# Patient Record
Sex: Male | Born: 1970 | Race: White | Hispanic: No | Smoking: Former smoker
Health system: Southern US, Community
[De-identification: ages and names within clinical notes are randomized; demographics above are authoritative.]

## PROBLEM LIST (undated history)

## (undated) ENCOUNTER — Emergency Department (HOSPITAL_COMMUNITY): Discharge status: Absent without leave

## (undated) DIAGNOSIS — K219 Gastro-esophageal reflux disease without esophagitis: Secondary | ICD-10-CM

## (undated) DIAGNOSIS — R1310 Dysphagia, unspecified: Secondary | ICD-10-CM

## (undated) HISTORY — PX: TONSILLECTOMY: SUR1361

## (undated) HISTORY — PX: ADENOIDECTOMY: SUR15

## (undated) HISTORY — PX: HERNIA REPAIR: SHX51

## (undated) HISTORY — PX: WRIST SURGERY: SHX841

## (undated) HISTORY — DX: Dysphagia, unspecified: R13.10

## (undated) HISTORY — PX: FACIAL FRACTURE SURGERY: SHX1570

---

## 2010-10-26 ENCOUNTER — Ambulatory Visit: Payer: Worker's Compensation | Admitting: Rehabilitative and Restorative Service Providers"

## 2010-10-27 ENCOUNTER — Ambulatory Visit: Payer: Worker's Compensation | Attending: Occupational Medicine

## 2010-10-27 DIAGNOSIS — M545 Low back pain, unspecified: Secondary | ICD-10-CM | POA: Insufficient documentation

## 2010-10-27 DIAGNOSIS — R5381 Other malaise: Secondary | ICD-10-CM | POA: Insufficient documentation

## 2010-10-27 DIAGNOSIS — IMO0001 Reserved for inherently not codable concepts without codable children: Secondary | ICD-10-CM | POA: Insufficient documentation

## 2010-10-27 DIAGNOSIS — M2560 Stiffness of unspecified joint, not elsewhere classified: Secondary | ICD-10-CM | POA: Insufficient documentation

## 2011-04-02 ENCOUNTER — Emergency Department (HOSPITAL_COMMUNITY)
Admission: EM | Admit: 2011-04-02 | Discharge: 2011-04-03 | Disposition: A | Discharge status: Home / Self Care | Attending: Emergency Medicine | Admitting: Emergency Medicine

## 2011-04-02 DIAGNOSIS — J45909 Unspecified asthma, uncomplicated: Secondary | ICD-10-CM | POA: Insufficient documentation

## 2011-04-02 DIAGNOSIS — R0602 Shortness of breath: Secondary | ICD-10-CM | POA: Insufficient documentation

## 2011-04-02 DIAGNOSIS — R0789 Other chest pain: Secondary | ICD-10-CM | POA: Insufficient documentation

## 2011-04-08 ENCOUNTER — Emergency Department (HOSPITAL_COMMUNITY)
Admission: EM | Admit: 2011-04-08 | Discharge: 2011-04-08 | Disposition: A | Discharge status: Home / Self Care | Attending: Emergency Medicine | Admitting: Emergency Medicine

## 2011-04-08 ENCOUNTER — Emergency Department (HOSPITAL_COMMUNITY)

## 2011-04-08 ENCOUNTER — Encounter (HOSPITAL_COMMUNITY): Payer: Self-pay | Admitting: Radiology

## 2011-04-08 DIAGNOSIS — J45901 Unspecified asthma with (acute) exacerbation: Secondary | ICD-10-CM | POA: Insufficient documentation

## 2011-04-08 DIAGNOSIS — R0609 Other forms of dyspnea: Secondary | ICD-10-CM | POA: Insufficient documentation

## 2011-04-08 DIAGNOSIS — R0989 Other specified symptoms and signs involving the circulatory and respiratory systems: Secondary | ICD-10-CM | POA: Insufficient documentation

## 2011-04-08 LAB — CBC
Hemoglobin: 16.2 g/dL (ref 13.0–17.0)
Platelets: 289 10*3/uL (ref 150–400)
RBC: 5.31 MIL/uL (ref 4.22–5.81)
WBC: 10.2 10*3/uL (ref 4.0–10.5)

## 2011-04-08 LAB — BASIC METABOLIC PANEL
CO2: 28 mEq/L (ref 19–32)
Glucose, Bld: 121 mg/dL — ABNORMAL HIGH (ref 70–99)
Potassium: 3.9 mEq/L (ref 3.5–5.1)
Sodium: 139 mEq/L (ref 135–145)

## 2011-04-08 LAB — TROPONIN I: Troponin I: <0.3 ng/mL (ref <0.30)

## 2011-04-08 LAB — D-DIMER, QUANTITATIVE: D-Dimer, Quant: 0.25 ug/mL-FEU (ref 0.00–0.48)

## 2011-04-08 MED ORDER — IOHEXOL 300 MG/ML  SOLN
100.0000 mL | Freq: Once | INTRAMUSCULAR | Status: AC | PRN
  Administered 2011-04-08: 100 mL via INTRAVENOUS

## 2011-10-29 ENCOUNTER — Ambulatory Visit: Payer: Self-pay

## 2011-10-29 ENCOUNTER — Other Ambulatory Visit: Payer: Self-pay | Admitting: Occupational Medicine

## 2011-10-29 DIAGNOSIS — Z0000 Encounter for general adult medical examination without abnormal findings: Secondary | ICD-10-CM

## 2012-06-28 DIAGNOSIS — M549 Dorsalgia, unspecified: Secondary | ICD-10-CM | POA: Insufficient documentation

## 2012-07-19 ENCOUNTER — Encounter (HOSPITAL_COMMUNITY): Payer: Self-pay | Admitting: Emergency Medicine

## 2012-07-19 ENCOUNTER — Observation Stay (HOSPITAL_COMMUNITY)
Admission: EM | Admit: 2012-07-19 | Discharge: 2012-07-20 | Disposition: A | Discharge status: Home / Self Care | Attending: Internal Medicine | Admitting: Internal Medicine

## 2012-07-19 DIAGNOSIS — J45901 Unspecified asthma with (acute) exacerbation: Principal | ICD-10-CM

## 2012-07-19 DIAGNOSIS — Z23 Encounter for immunization: Secondary | ICD-10-CM | POA: Insufficient documentation

## 2012-07-19 DIAGNOSIS — E876 Hypokalemia: Secondary | ICD-10-CM | POA: Diagnosis present

## 2012-07-19 DIAGNOSIS — J45909 Unspecified asthma, uncomplicated: Secondary | ICD-10-CM | POA: Diagnosis present

## 2012-07-19 DIAGNOSIS — J029 Acute pharyngitis, unspecified: Secondary | ICD-10-CM | POA: Diagnosis present

## 2012-07-19 NOTE — ED Provider Notes (Signed)
History     CSN: [id number]  Arrival date & time 07/19/12  2347   First MD Initiated Contact with Patient 07/19/12 2348      Chief Complaint  Patient presents with  . Asthma    (Consider location/radiation/quality/duration/timing/severity/associated sxs/prior treatment) Patient is a 41 y.o. male presenting with asthma and wheezing. The history is provided by the patient. No language interpreter was used.  Asthma This is a recurrent problem. The current episode started 1 to 2 hours ago. The problem occurs constantly. The problem has not changed since onset.Pertinent negatives include no chest pain, no abdominal pain, no headaches and no shortness of breath. Associated symptoms comments: Sore throat. Nothing aggravates the symptoms. Nothing relieves the symptoms. He has tried nothing for the symptoms. The treatment provided no relief.  Wheezing  The current episode started today. The onset was sudden. The problem occurs continuously. The problem has been unchanged. The problem is moderate. The symptoms are relieved by beta-agonist inhalers. Nothing aggravates the symptoms. Associated symptoms include sore throat and wheezing. Pertinent negatives include no chest pain, no fever, no rhinorrhea, no stridor and no shortness of breath. His past medical history is significant for asthma. He has been behaving normally. Recently, medical care has been given by EMS. Services received include medications given.    Past Medical History  Diagnosis Date  . Asthma     No past surgical history on file.  No family history on file.  History  Substance Use Topics  . Smoking status: Not on file  . Smokeless tobacco: Not on file  . Alcohol Use: Not on file      Review of Systems  Constitutional: Negative for fever.  HENT: Positive for sore throat. Negative for rhinorrhea.   Respiratory: Positive for wheezing. Negative for shortness of breath and stridor.   Cardiovascular: Negative for chest  pain.  Gastrointestinal: Negative for abdominal pain.  Neurological: Negative for headaches.  All other systems reviewed and are negative.    Allergies  Review of patient's allergies indicates no known allergies.  Home Medications  No current outpatient prescriptions on file.  There were no vitals taken for this visit.  Physical Exam  Constitutional: He is oriented to person, place, and time. He appears well-developed and well-nourished. No distress.  HENT:  Head: Normocephalic and atraumatic.  Mouth/Throat: Oropharynx is clear and moist.  Eyes: Conjunctivae normal are normal. Pupils are equal, round, and reactive to light.  Neck: Normal range of motion. Neck supple.  Cardiovascular: Normal rate, regular rhythm and intact distal pulses.   Pulmonary/Chest: Effort normal. No stridor. He has wheezes.  Abdominal: Soft. Bowel sounds are normal. There is no tenderness. There is no rebound and no guarding.  Musculoskeletal: Normal range of motion.  Lymphadenopathy:    He has no cervical adenopathy.  Neurological: He is alert and oriented to person, place, and time.  Skin: Skin is warm and dry.  Psychiatric: He has a normal mood and affect.    ED Course  Procedures (including critical care time)  Labs Reviewed - No data to display No results found.   No diagnosis found.    MDM   Date: 07/20/2012  Rate: 118  Rhythm: sinus tachycardia  QRS Axis: normal  Intervals: normal  ST/T Wave abnormalities: nonspecific ST changes  Conduction Disutrbances:none  Narrative Interpretation:   Old EKG Reviewed: none available     MDM Interpretation: labs, ECG and x-ray Total time providing critical care: 30-74 minutes. This excludes time spent performing  separately reportable procedures and services. Consults: admitting MD  bolus and treatment of hypokalemia  CRITICAL CARE Performed by: Jasmine Awe   Total critical care time: 30 minutes  Critical care time was  exclusive of separately billable procedures and treating other patients.  Critical care was necessary to treat or prevent imminent or life-threatening deterioration.  Critical care was time spent personally by me on the following activities: development of treatment plan with patient and/or surrogate as well as nursing, discussions with consultants, evaluation of patient's response to treatment, examination of patient, obtaining history from patient or surrogate, ordering and performing treatments and interventions, ordering and review of laboratory studies, ordering and review of radiographic studies, pulse oximetry and re-evaluation of patient's condition.    Jasmine Awe, MD 07/20/12 [phone]

## 2012-07-19 NOTE — ED Notes (Signed)
Pt arrived by ems. Pt woken from sleep with asthma attack. ems administered albuterol neb 5mg . Followed by 125 solu-medrol. Also second neb 5mg  albuterol of atrovent. Lungs sounds improved however pt states he doesn't feel much better. Pt. Stats 99% with tx. Vs wnl; sinus tach. bp 142/82.

## 2012-07-20 ENCOUNTER — Other Ambulatory Visit: Payer: Self-pay

## 2012-07-20 ENCOUNTER — Encounter (HOSPITAL_COMMUNITY): Payer: Self-pay | Admitting: General Practice

## 2012-07-20 ENCOUNTER — Emergency Department (HOSPITAL_COMMUNITY)

## 2012-07-20 DIAGNOSIS — J45909 Unspecified asthma, uncomplicated: Secondary | ICD-10-CM | POA: Diagnosis present

## 2012-07-20 DIAGNOSIS — J029 Acute pharyngitis, unspecified: Secondary | ICD-10-CM | POA: Diagnosis present

## 2012-07-20 DIAGNOSIS — E876 Hypokalemia: Secondary | ICD-10-CM | POA: Diagnosis present

## 2012-07-20 DIAGNOSIS — J45901 Unspecified asthma with (acute) exacerbation: Secondary | ICD-10-CM

## 2012-07-20 LAB — CBC
HCT: 40.5 % (ref 39.0–52.0)
Hemoglobin: 14.4 g/dL (ref 13.0–17.0)
MCHC: 35.6 g/dL (ref 30.0–36.0)
MCV: 84 fL (ref 78.0–100.0)
WBC: 8.1 10*3/uL (ref 4.0–10.5)

## 2012-07-20 LAB — CBC WITH DIFFERENTIAL/PLATELET
Basophils Absolute: 0 10*3/uL (ref 0.0–0.1)
Basophils Relative: 0 % (ref 0–1)
HCT: 41.6 % (ref 39.0–52.0)
Hemoglobin: 14.7 g/dL (ref 13.0–17.0)
Lymphocytes Relative: 37 % (ref 12–46)
MCHC: 35.3 g/dL (ref 30.0–36.0)
Monocytes Absolute: 0.7 10*3/uL (ref 0.1–1.0)
Monocytes Relative: 7 % (ref 3–12)
Neutro Abs: 4.9 10*3/uL (ref 1.7–7.7)
Neutrophils Relative %: 54 % (ref 43–77)
RDW: 12.3 % (ref 11.5–15.5)
WBC: 9.1 10*3/uL (ref 4.0–10.5)

## 2012-07-20 LAB — POCT I-STAT, CHEM 8
Chloride: 107 mEq/L (ref 96–112)
Glucose, Bld: 110 mg/dL — ABNORMAL HIGH (ref 70–99)
HCT: 41 % (ref 39.0–52.0)
Hemoglobin: 13.9 g/dL (ref 13.0–17.0)
Potassium: 2.8 mEq/L — ABNORMAL LOW (ref 3.5–5.1)
Sodium: 143 mEq/L (ref 135–145)

## 2012-07-20 LAB — POCT I-STAT TROPONIN I: Troponin i, poc: 0 ng/mL (ref 0.00–0.08)

## 2012-07-20 LAB — BASIC METABOLIC PANEL
BUN: 14 mg/dL (ref 6–23)
CO2: 25 mEq/L (ref 19–32)
Chloride: 107 mEq/L (ref 96–112)
GFR calc Af Amer: >90 mL/min (ref >90)
Potassium: 4.3 mEq/L (ref 3.5–5.1)

## 2012-07-20 MED ORDER — PNEUMOCOCCAL VAC POLYVALENT 25 MCG/0.5ML IJ INJ
0.5000 mL | INJECTION | INTRAMUSCULAR | Status: AC
  Administered 2012-07-20: 0.5 mL via INTRAMUSCULAR
  Filled 2012-07-20: qty 0.5

## 2012-07-20 MED ORDER — ACETAMINOPHEN 650 MG RE SUPP: 650.0000 mg | Freq: Four times a day (QID) | RECTAL | Status: DC | PRN

## 2012-07-20 MED ORDER — INFLUENZA VIRUS VACC SPLIT PF IM SUSP: 0.5000 mL | INTRAMUSCULAR | Status: DC

## 2012-07-20 MED ORDER — LEVOFLOXACIN 500 MG PO TABS: 500.0000 mg | ORAL_TABLET | Freq: Every day | ORAL | Status: DC

## 2012-07-20 MED ORDER — ONDANSETRON HCL 4 MG PO TABS: 4.0000 mg | ORAL_TABLET | Freq: Four times a day (QID) | ORAL | Status: DC | PRN

## 2012-07-20 MED ORDER — SODIUM CHLORIDE 0.9 % IJ SOLN
3.0000 mL | Freq: Two times a day (BID) | INTRAMUSCULAR | Status: DC
  Administered 2012-07-20: 3 mL via INTRAVENOUS

## 2012-07-20 MED ORDER — DEXTROSE 5 % IV SOLN
1.0000 g | Freq: Once | INTRAVENOUS | Status: AC
  Administered 2012-07-20: 1 g via INTRAVENOUS
  Filled 2012-07-20: qty 10

## 2012-07-20 MED ORDER — PREDNISONE 20 MG PO TABS: 40.0000 mg | ORAL_TABLET | Freq: Every day | ORAL | Status: DC

## 2012-07-20 MED ORDER — LORATADINE-PSEUDOEPHEDRINE ER 5-120 MG PO TB12: 1.0000 | ORAL_TABLET | Freq: Two times a day (BID) | ORAL | Status: DC

## 2012-07-20 MED ORDER — POTASSIUM CHLORIDE 10 MEQ/100ML IV SOLN
10.0000 meq | INTRAVENOUS | Status: AC
  Administered 2012-07-20 (×4): 10 meq via INTRAVENOUS
  Filled 2012-07-20 (×5): qty 100

## 2012-07-20 MED ORDER — DEXTROSE 5 % IV SOLN
500.0000 mg | Freq: Once | INTRAVENOUS | Status: AC
  Administered 2012-07-20: 500 mg via INTRAVENOUS
  Filled 2012-07-20: qty 500

## 2012-07-20 MED ORDER — ALBUTEROL SULFATE (5 MG/ML) 0.5% IN NEBU
5.0000 mg | INHALATION_SOLUTION | Freq: Once | RESPIRATORY_TRACT | Status: AC
  Administered 2012-07-20: 5 mg via RESPIRATORY_TRACT
  Filled 2012-07-20: qty 1

## 2012-07-20 MED ORDER — ACETAMINOPHEN 325 MG PO TABS: 650.0000 mg | ORAL_TABLET | Freq: Four times a day (QID) | ORAL | Status: DC | PRN

## 2012-07-20 MED ORDER — LEVOFLOXACIN IN D5W 500 MG/100ML IV SOLN
500.0000 mg | INTRAVENOUS | Status: DC
  Administered 2012-07-20: 500 mg via INTRAVENOUS
  Filled 2012-07-20 (×2): qty 100

## 2012-07-20 MED ORDER — PREDNISONE 20 MG PO TABS
40.0000 mg | ORAL_TABLET | Freq: Every day | ORAL | Status: DC
  Administered 2012-07-20: 40 mg via ORAL
  Filled 2012-07-20 (×2): qty 2

## 2012-07-20 MED ORDER — POTASSIUM CHLORIDE IN NACL 40-0.9 MEQ/L-% IV SOLN
INTRAVENOUS | Status: DC
  Administered 2012-07-20: 06:00:00 via INTRAVENOUS
  Filled 2012-07-20 (×4): qty 1000

## 2012-07-20 MED ORDER — ONDANSETRON HCL 4 MG/2ML IJ SOLN: 4.0000 mg | Freq: Four times a day (QID) | INTRAMUSCULAR | Status: DC | PRN

## 2012-07-20 MED ORDER — INFLUENZA VIRUS VACC SPLIT PF IM SUSP
0.5000 mL | INTRAMUSCULAR | Status: AC
  Administered 2012-07-20: 0.5 mL via INTRAMUSCULAR
  Filled 2012-07-20: qty 0.5

## 2012-07-20 MED ORDER — ALBUTEROL SULFATE (5 MG/ML) 0.5% IN NEBU
2.5000 mg | INHALATION_SOLUTION | Freq: Four times a day (QID) | RESPIRATORY_TRACT | Status: DC
  Administered 2012-07-20 (×2): 2.5 mg via RESPIRATORY_TRACT
  Filled 2012-07-20 (×2): qty 0.5

## 2012-07-20 MED ORDER — ALBUTEROL SULFATE HFA 108 (90 BASE) MCG/ACT IN AERS: 2.0000 | INHALATION_SPRAY | Freq: Four times a day (QID) | RESPIRATORY_TRACT | Status: DC | PRN

## 2012-07-20 MED ORDER — ALBUTEROL SULFATE (5 MG/ML) 0.5% IN NEBU: 2.5000 mg | INHALATION_SOLUTION | RESPIRATORY_TRACT | Status: DC | PRN

## 2012-07-20 NOTE — Discharge Summary (Signed)
Physician Discharge Summary  Dustin Macias BJY:[id number] DOB: 10/14/1970 DOA: 07/19/2012  PCP: Dustin Lightning, MD  Admit date: 07/19/2012 Discharge date: 07/20/2012  Time spent: 60 minutes  Recommendations for Outpatient Follow-up:  1. Followup with PCP one week post discharge. Or followup basic metabolic profile need to be obtained to followup on patient's electrolytes. Patient also need followup on his asthma exacerbation.  Discharge Diagnoses:  Principal Problem:  *Asthma exacerbation Active Problems:  Sorethroat  Hypokalemia   Discharge Condition:  Stable and improved  Diet recommendation: Regular  Filed Weights   07/20/12 0313  Weight: 114.7 kg (252 lb 13.9 oz)    History of present illness:  Dustin Macias is an 41 y.o. male with hx of asthma on PRN ventolin inhaler, otherwise healthy on no meds, presents to the ER with sorethroat and wheezing for the past 2 days. He has no fever, chest pain, n/v, abd pain or myalgia. He has a sorethroat and wasn't able to speak. He has no distant travel or ill contact. He was seen last time for asthma exarcebation last June, in the ER, but was not admitted at that time. In the ER, he was given nebs, which improved his breathing markedly and he was able to talk. Work up in the ER included an EKG with ST and no acute ST-T changes, a CXR with ?Left lower lobe atelectasis vs infiltrate. He has no fever or cough, and has no leukocytosis. He was however found to have a K of 2.8. Hospitalist was asked to admit him for "PNA and hypokalemia"   Hospital Course:  #1 acute asthma exacerbation Patient was admitted with acute asthma exacerbation. Chest x-ray was worrisome for possible pneumonia however patient was afebrile had a normal white count would and did not have a productive cough. Patient was placed on oral prednisone he was placed on nebulizer treatments as well as IV Levaquin. Patient improved clinically to be discharged home on a steroid  taper, albuterol, 5 days of oral Levaquin and Claritin-D. Patient is to followup with PCP one week post discharge.  #2 hypokalemia On admission patient was noted to be hypokalemic which was likely secondary to albuterol use. Patient's potassium was repleted and his hypokalemia had resolved by day of discharge. On admission patient's potassium was 2.8 by day of discharge his potassium was 4.3. Patient will followup with PCP one week post discharge and a basic metabolic profile need to be obtained to followup on his electrolytes.  Procedures:  X-ray of the neck 07/20/2012  Chest x-ray 07/20/2012  Consultations:  None  Discharge Exam: Filed Vitals:   07/20/12 0500 07/20/12 0908 07/20/12 1356 07/20/12 1422  BP: 128/77  135/73   Pulse: 105  92   Temp: 98.7 F (37.1 C)  97.7 F (36.5 C)   TempSrc: Oral  Oral   Resp: 20  20   Height:      Weight:      SpO2: 96% 95% 95% 95%    General: NAD Cardiovascular: RRR Respiratory: CTAB  Discharge Instructions  Discharge Orders    Future Orders Please Complete By Expires   Diet general      Increase activity slowly      Discharge instructions      Comments:   Follow up with Dustin Lightning, MD in 1 week.       Medication List     As of 07/20/2012  3:11 PM    STOP taking these medications  albuterol (2.5 MG/3ML) 0.083% nebulizer solution   Commonly known as: PROVENTIL      ipratropium 0.02 % nebulizer solution   Commonly known as: ATROVENT      methylPREDNISolone sodium succinate 125 mg/2 mL injection   Commonly known as: SOLU-MEDROL      TAKE these medications         albuterol 108 (90 BASE) MCG/ACT inhaler   Commonly known as: PROVENTIL HFA;VENTOLIN HFA   Inhale 2 puffs into the lungs every 6 (six) hours as needed for wheezing or shortness of breath. Use every 6 hours x 4 days then use as needed.      diclofenac 75 MG EC tablet   Commonly known as: VOLTAREN   Take 75 mg by mouth 2 (two) times daily.       levofloxacin 500 MG tablet   Commonly known as: LEVAQUIN   Take 1 tablet (500 mg total) by mouth daily. Take for 5 days then stop.      loratadine-pseudoephedrine 5-120 MG per tablet   Commonly known as: CLARITIN-D 12-hour   Take 1 tablet by mouth 2 (two) times daily. Take for 5 days then stop      NYQUIL PO   Take 30 mLs by mouth every 6 (six) hours as needed. For cold symptoms      predniSONE 20 MG tablet   Commonly known as: DELTASONE   Take 2 tablets (40 mg total) by mouth daily before breakfast. Take 2 tablets (40mg ) daily x 3 days, then 1 tablet (20mg ) daily x 3 days then stop      tiZANidine 4 MG tablet   Commonly known as: ZANAFLEX   Take 4 mg by mouth every 6 (six) hours as needed. For muscle spasms      traMADol 50 MG tablet   Commonly known as: ULTRAM   Take 50 mg by mouth every 6 (six) hours as needed. For pain           Follow-up Information    Follow up with Dustin Lightning, MD. Schedule an appointment as soon as possible for a visit in 1 week.   Contact information:   [address] [facility] [place] [address] [phone]           The results of significant diagnostics from this hospitalization (including imaging, microbiology, ancillary and laboratory) are listed below for reference.    Significant Diagnostic Studies: Dg Neck Soft Tissue  07/20/2012  *RADIOLOGY REPORT*  Clinical Data: Shortness of breath, hoarseness, throat pain.  NECK SOFT TISSUES - 1+ VIEW  Comparison:  None.  Findings:  There is no evidence of retropharyngeal soft tissue swelling or epiglottic enlargement.  The cervical airway is unremarkable and no radio-opaque foreign body identified.  IMPRESSION: Negative.   Original Report Authenticated By: Charlett Nose, M.D.    Dg Chest 2 View  07/20/2012  *RADIOLOGY REPORT*  Clinical Data: Asthma exacerbation  CHEST - 2 VIEW  Comparison: 10/29/2011  Findings: The heart size and mediastinal contours are  within normal limits. Atelectasis or infiltrate is noted within the left lower lobe.  There are mild, age indeterminate compression deformities within the mid thoracic spine.  The visualized skeletal structures are unremarkable.  IMPRESSION: 1.  Left base opacity which may represent atelectasis or infiltrate. 2.  Mild age indeterminant thoracic compression deformities.   Original Report Authenticated By: Signa Kell, M.D.     Microbiology: Recent Results (from the past 240 hour(s))  RAPID STREP  SCREEN     Status: Normal   Collection Time   07/20/12 12:06 AM      Component Value Range Status Comment   Streptococcus, Group A Screen (Direct) NEGATIVE  NEGATIVE Final      Labs: Basic Metabolic Panel:  Lab 07/20/12 1610 07/20/12 0137 07/20/12 0019  NA 141 -- 143  K 4.3 -- 2.8*  CL 107 -- 107  CO2 25 -- --  GLUCOSE 182* -- 110*  BUN 14 -- 22  CREATININE 0.75 -- 1.10  CALCIUM 9.1 -- --  MG -- 2.0 --  PHOS -- -- --   Liver Function Tests: No results found for this basename: AST:5,ALT:5,ALKPHOS:5,BILITOT:5,PROT:5,ALBUMIN:5 in the last 168 hours No results found for this basename: LIPASE:5,AMYLASE:5 in the last 168 hours No results found for this basename: AMMONIA:5 in the last 168 hours CBC:  Lab 07/20/12 0812 07/20/12 0019 07/20/12  WBC 8.1 -- 9.1  NEUTROABS -- -- 4.9  HGB 14.4 13.9 14.7  HCT 40.5 41.0 41.6  MCV 84.0 -- 84.0  PLT 227 -- 212   Cardiac Enzymes: No results found for this basename: CKTOTAL:5,CKMB:5,CKMBINDEX:5,TROPONINI:5 in the last 168 hours BNP: BNP (last 3 results) No results found for this basename: PROBNP:3 in the last 8760 hours CBG: No results found for this basename: GLUCAP:5 in the last 168 hours     Signed:  Zaeda Mcferran  [facility] 07/20/2012, 3:11 PM

## 2012-07-20 NOTE — Plan of Care (Signed)
Problem: Phase I Progression Outcomes Goal: Initial discharge plan identified Outcome: Completed/Met Date Met:  07/20/12 To return home with girlfriend

## 2012-07-20 NOTE — H&P (Signed)
[facility] History and Physical  Dustin Macias YQM:[id number] DOB: 1971-02-22    PCP:   Sheila Oats, MD   Chief Complaint: shortness of breath and sorethroat.  HPI: Dustin Macias is an 41 y.o. male with hx of asthma on PRN ventolin inhaler, otherwise healthy on no meds, presents to the ER with sorethroat and wheezing for the past 2 days.  He has no fever, chest pain, n/v, abd pain or myalgia.  He has a sorethroat and wasn't able to speak.  He has no distant travel or ill contact.  He was seen last time for asthma exarcebation last June, in the ER, but was not admitted at that time.  In the ER, he was given nebs, which improved his breathing markedly and he was able to talk.  Work up in the ER included an EKG with ST and no acute ST-T changes, a CXR with ?Left lower lobe atelectasis vs infiltrate.  He has no fever or cough, and has no leukocytosis.  He was however found to have a K of 2.8.  Hospitalist was asked to admit him for "PNA and hypokalemia"  Rewiew of Systems:  Constitutional: Negative for malaise, fever and chills. No significant weight loss or weight gain Eyes: Negative for eye pain, redness and discharge, diplopia, visual changes, or flashes of light. ENMT: Negative for ear pain, hoarseness, nasal congestion, sinus pressure  No headaches; tinnitus, drooling, or problem swallowing. Cardiovascular: Negative for chest pain, palpitations, diaphoresis, dyspnea and peripheral edema. ; No orthopnea, PND Respiratory: Negative for cough, hemoptysis,and stridor. No pleuritic chestpain. Gastrointestinal: Negative for nausea, vomiting, diarrhea, constipation, abdominal pain, melena, blood in stool, hematemesis, jaundice and rectal bleeding.    Genitourinary: Negative for frequency, dysuria, incontinence,flank pain and hematuria; Musculoskeletal: Negative for back pain and neck pain. Negative for swelling and trauma.;  Skin: . Negative for pruritus, rash, abrasions, bruising and  skin lesion.; ulcerations Neuro: Negative for headache, lightheadedness and neck stiffness. Negative for weakness, altered level of consciousness , altered mental status, extremity weakness, burning feet, involuntary movement, seizure and syncope.  Psych: negative for anxiety, depression, insomnia, tearfulness, panic attacks, hallucinations, paranoia, suicidal or homicidal ideation    Past Medical History  Diagnosis Date  . Asthma     History reviewed. No pertinent past surgical history.  Medications:  HOME MEDS: Prior to Admission medications   Not on File     Allergies:  No Known Allergies  Social History:   reports that he has never smoked. He does not have any smokeless tobacco history on file. He reports that he does not drink alcohol or use illicit drugs.  Family History: History reviewed. No pertinent family history.   Physical Exam: Filed Vitals:   07/20/12 0012 07/20/12 0025  BP: 143/81   Pulse: 105   Temp: 97.8 F (36.6 C)   TempSrc: Oral   Resp:  12  SpO2: 95%    Blood pressure 143/81, pulse 105, temperature 97.8 F (36.6 C), temperature source Oral, resp. rate 12, SpO2 95.00%.  GEN:  Pleasant  patient lying in the stretcher in no acute distress; cooperative with exam. PSYCH:  alert and oriented x4; does not appear anxious or depressed; affect is appropriate. HEENT: Mucous membranes pink and anicteric; PERRLA; EOM intact; no cervical lymphadenopathy nor thyromegaly or carotid bruit; no JVD; There were no stridor. Neck is very supple. Breasts:: Not examined CHEST WALL: No tenderness CHEST: Normal respiration, wheezing is minimal if any. HEART: Regular rate and rhythm.  There are no murmur,  rub, or gallops.   BACK: No kyphosis or scoliosis; no CVA tenderness ABDOMEN: soft and non-tender; no masses, no organomegaly, normal abdominal bowel sounds; no pannus; no intertriginous candida. There is no rebound and no distention. Rectal Exam: Not  done EXTREMITIES: No bone or joint deformity; age-appropriate arthropathy of the hands and knees; no edema; no ulcerations.  There is no calf tenderness. Genitalia: not examined PULSES: 2+ and symmetric SKIN: Normal hydration no rash or ulceration CNS: Cranial nerves 2-12 grossly intact no focal lateralizing neurologic deficit.  Speech is fluent; uvula elevated with phonation, facial symmetry and tongue midline. DTR are normal bilaterally, cerebella exam is intact, barbinski is negative and strengths are equaled bilaterally.  No sensory loss.   Labs on Admission:  Basic Metabolic Panel:  Lab 07/20/12 1191  NA 143  K 2.8*  CL 107  CO2 --  GLUCOSE 110*  BUN 22  CREATININE 1.10  CALCIUM --  MG --  PHOS --   Liver Function Tests: No results found for this basename: AST:5,ALT:5,ALKPHOS:5,BILITOT:5,PROT:5,ALBUMIN:5 in the last 168 hours No results found for this basename: LIPASE:5,AMYLASE:5 in the last 168 hours No results found for this basename: AMMONIA:5 in the last 168 hours CBC:  Lab 07/20/12 0019 07/20/12  WBC -- 9.1  NEUTROABS -- 4.9  HGB 13.9 14.7  HCT 41.0 41.6  MCV -- 84.0  PLT -- 212   Cardiac Enzymes: No results found for this basename: CKTOTAL:5,CKMB:5,CKMBINDEX:5,TROPONINI:5 in the last 168 hours  CBG: No results found for this basename: GLUCAP:5 in the last 168 hours   Radiological Exams on Admission: Dg Neck Soft Tissue  07/20/2012  *RADIOLOGY REPORT*  Clinical Data: Shortness of breath, hoarseness, throat pain.  NECK SOFT TISSUES - 1+ VIEW  Comparison:  None.  Findings:  There is no evidence of retropharyngeal soft tissue swelling or epiglottic enlargement.  The cervical airway is unremarkable and no radio-opaque foreign body identified.  IMPRESSION: Negative.   Original Report Authenticated By: Charlett Nose, M.D.    Dg Chest 2 View  07/20/2012  *RADIOLOGY REPORT*  Clinical Data: Asthma exacerbation  CHEST - 2 VIEW  Comparison: 10/29/2011  Findings: The  heart size and mediastinal contours are within normal limits. Atelectasis or infiltrate is noted within the left lower lobe.  There are mild, age indeterminate compression deformities within the mid thoracic spine.  The visualized skeletal structures are unremarkable.  IMPRESSION: 1.  Left base opacity which may represent atelectasis or infiltrate. 2.  Mild age indeterminant thoracic compression deformities.   Original Report Authenticated By: Signa Kell, M.D.     EKG: Independently reviewed.  S tach with no acute ST -T changes.   Assessment/Plan Present on Admission:  . Asthma exacerbation . Sorethroat . Hypokalemia  PLAN:  Will admit him for asthma exarcebation.  I don't think he has PNA, as he doesn't have fever nor leukocytosis, nor coughs.  He was given Rocephin and Zithromax, but I will change him to IV Levaquin.  Will give him PO Prednisone.  He doesn't need IV steroids.  He has hypokalemia, and will be supplemented with 4 runs of IV KCl, and check a magnesium level as well.  I will add K to his IVF.   He is very stable, full code, and will be admitted to [facility] service.  He would benefit getting Advair and PO KCL supplement upon discharge.  Other plans as per orders.  Code Status: FULL Unk Lightning, MD. [facility] Pager [phone] 7pm to 7am.  07/20/2012, 1:27 AM

## 2012-07-20 NOTE — ED Notes (Signed)
Pt brought to ED by EMS with SOB and complaining that he can not talk

## 2012-07-20 NOTE — Progress Notes (Signed)
Dustin Macias discharged Home per MD order.  Discharge instructions reviewed and discussed with the patient, all questions and concerns answered. Copy of instructions and scripts given to patient.  Explained how to sign up for Mychart.   Dustin Macias, Dustin Macias  Home Medication Instructions [id number]   Printed on:07/20/12 1640  Medication Information                    Pseudoeph-Doxylamine-DM-APAP (NYQUIL PO) Take 30 mLs by mouth every 6 (six) hours as needed. For cold symptoms           traMADol (ULTRAM) 50 MG tablet Take 50 mg by mouth every 6 (six) hours as needed. For pain           diclofenac (VOLTAREN) 75 MG EC tablet Take 75 mg by mouth 2 (two) times daily.           tiZANidine (ZANAFLEX) 4 MG tablet Take 4 mg by mouth every 6 (six) hours as needed. For muscle spasms           albuterol (PROVENTIL HFA;VENTOLIN HFA) 108 (90 BASE) MCG/ACT inhaler Inhale 2 puffs into the lungs every 6 (six) hours as needed for wheezing or shortness of breath. Use every 6 hours x 4 days then use as needed.           predniSONE (DELTASONE) 20 MG tablet Take 2 tablets (40 mg total) by mouth daily before breakfast. Take 2 tablets (40mg ) daily x 3 days, then 1 tablet (20mg ) daily x 3 days then stop           levofloxacin (LEVAQUIN) 500 MG tablet Take 1 tablet (500 mg total) by mouth daily. Take for 5 days then stop.           loratadine-pseudoephedrine (CLARITIN-D 12 HOUR) 5-120 MG per tablet Take 1 tablet by mouth 2 (two) times daily. Take for 5 days then stop             Patients skin is clean, dry and intact, no evidence of skin break down. IV site discontinued and catheter remains intact. Site without signs and symptoms of complications. Dressing and pressure applied.  Patient escorted to car by NT in a wheelchair,  no distress noted upon discharge.  Dustin Macias, Dustin Macias 07/20/2012 4:40 PM

## 2015-10-14 DIAGNOSIS — K219 Gastro-esophageal reflux disease without esophagitis: Secondary | ICD-10-CM | POA: Insufficient documentation

## 2015-12-23 ENCOUNTER — Emergency Department (HOSPITAL_COMMUNITY)

## 2015-12-23 ENCOUNTER — Encounter (HOSPITAL_COMMUNITY): Payer: Self-pay | Admitting: Emergency Medicine

## 2015-12-23 ENCOUNTER — Emergency Department (HOSPITAL_COMMUNITY)
Admission: EM | Admit: 2015-12-23 | Discharge: 2015-12-23 | Disposition: A | Discharge status: Home / Self Care | Attending: Emergency Medicine | Admitting: Emergency Medicine

## 2015-12-23 DIAGNOSIS — N200 Calculus of kidney: Secondary | ICD-10-CM | POA: Diagnosis not present

## 2015-12-23 DIAGNOSIS — Z791 Long term (current) use of non-steroidal anti-inflammatories (NSAID): Secondary | ICD-10-CM | POA: Insufficient documentation

## 2015-12-23 DIAGNOSIS — J45909 Unspecified asthma, uncomplicated: Secondary | ICD-10-CM | POA: Diagnosis not present

## 2015-12-23 DIAGNOSIS — Z79899 Other long term (current) drug therapy: Secondary | ICD-10-CM | POA: Diagnosis not present

## 2015-12-23 DIAGNOSIS — R109 Unspecified abdominal pain: Secondary | ICD-10-CM | POA: Diagnosis present

## 2015-12-23 LAB — COMPREHENSIVE METABOLIC PANEL WITH GFR
ALT: 25 U/L (ref 17–63)
AST: 23 U/L (ref 15–41)
Albumin: 4.1 g/dL (ref 3.5–5.0)
Alkaline Phosphatase: 75 U/L (ref 38–126)
Anion gap: 10 (ref 5–15)
BUN: 16 mg/dL (ref 6–20)
CO2: 24 mmol/L (ref 22–32)
Calcium: 9.1 mg/dL (ref 8.9–10.3)
Chloride: 105 mmol/L (ref 101–111)
Creatinine, Ser: 1.13 mg/dL (ref 0.61–1.24)
GFR calc Af Amer: >60 mL/min
GFR calc non Af Amer: >60 mL/min
Glucose, Bld: 120 mg/dL — ABNORMAL HIGH (ref 65–99)
Potassium: 3.9 mmol/L (ref 3.5–5.1)
Sodium: 139 mmol/L (ref 135–145)
Total Bilirubin: 1.1 mg/dL (ref 0.3–1.2)
Total Protein: 8 g/dL (ref 6.5–8.1)

## 2015-12-23 LAB — URINALYSIS, ROUTINE W REFLEX MICROSCOPIC
BILIRUBIN URINE: NEGATIVE
Glucose, UA: NEGATIVE mg/dL
Ketones, ur: NEGATIVE mg/dL
Leukocytes, UA: NEGATIVE
NITRITE: NEGATIVE
PH: 6 (ref 5.0–8.0)
Protein, ur: NEGATIVE mg/dL
SPECIFIC GRAVITY, URINE: 1.013 (ref 1.005–1.030)

## 2015-12-23 LAB — CBC
HEMATOCRIT: 42.5 % (ref 39.0–52.0)
Hemoglobin: 14.9 g/dL (ref 13.0–17.0)
MCH: 29.4 pg (ref 26.0–34.0)
MCHC: 35.1 g/dL (ref 30.0–36.0)
MCV: 84 fL (ref 78.0–100.0)
PLATELETS: 306 10*3/uL (ref 150–400)
RBC: 5.06 MIL/uL (ref 4.22–5.81)
RDW: 12.8 % (ref 11.5–15.5)
WBC: 17.3 10*3/uL — ABNORMAL HIGH (ref 4.0–10.5)

## 2015-12-23 LAB — URINE MICROSCOPIC-ADD ON
BACTERIA UA: NONE SEEN
SQUAMOUS EPITHELIAL / LPF: NONE SEEN

## 2015-12-23 LAB — LIPASE, BLOOD: Lipase: 27 U/L (ref 11–51)

## 2015-12-23 MED ORDER — HYDROCODONE-ACETAMINOPHEN 5-325 MG PO TABS: 2.0000 | ORAL_TABLET | ORAL | Status: DC | PRN

## 2015-12-23 MED ORDER — SODIUM CHLORIDE 0.9 % IV BOLUS (SEPSIS)
1000.0000 mL | Freq: Once | INTRAVENOUS | Status: AC
  Administered 2015-12-23: 1000 mL via INTRAVENOUS

## 2015-12-23 MED ORDER — FENTANYL CITRATE (PF) 100 MCG/2ML IJ SOLN
50.0000 ug | INTRAMUSCULAR | Status: AC | PRN
  Administered 2015-12-23 (×2): 50 ug via INTRAVENOUS
  Filled 2015-12-23 (×2): qty 2

## 2015-12-23 MED ORDER — KETOROLAC TROMETHAMINE 15 MG/ML IJ SOLN
15.0000 mg | Freq: Once | INTRAMUSCULAR | Status: AC
  Administered 2015-12-23: 15 mg via INTRAVENOUS
  Filled 2015-12-23: qty 1

## 2015-12-23 MED ORDER — KETOROLAC TROMETHAMINE 30 MG/ML IJ SOLN
30.0000 mg | Freq: Once | INTRAMUSCULAR | Status: AC
  Administered 2015-12-23: 30 mg via INTRAVENOUS
  Filled 2015-12-23: qty 1

## 2015-12-23 MED ORDER — TAMSULOSIN HCL 0.4 MG PO CAPS
0.4000 mg | ORAL_CAPSULE | Freq: Once | ORAL | Status: AC
  Administered 2015-12-23: 0.4 mg via ORAL
  Filled 2015-12-23: qty 1

## 2015-12-23 MED ORDER — TAMSULOSIN HCL 0.4 MG PO CAPS: 0.4000 mg | ORAL_CAPSULE | Freq: Every day | ORAL | Status: DC

## 2015-12-23 MED ORDER — ONDANSETRON HCL 4 MG/2ML IJ SOLN
4.0000 mg | Freq: Once | INTRAMUSCULAR | Status: AC
  Administered 2015-12-23: 4 mg via INTRAVENOUS
  Filled 2015-12-23: qty 2

## 2015-12-23 NOTE — ED Notes (Signed)
Patient transported to CT 

## 2015-12-23 NOTE — ED Notes (Signed)
Pt presents with sever right side flank pain since this am. Sudden onset, pt uncomfortable and ambulates frequently. A.O

## 2015-12-23 NOTE — ED Provider Notes (Signed)
CSN: [id number]     Arrival date & time 12/23/15  9147 History   First MD Initiated Contact with Patient 12/23/15 [phone]     Chief Complaint  Patient presents with  . Flank Pain     (Consider location/radiation/quality/duration/timing/severity/associated sxs/prior Treatment) HPI  Dustin Macias is a 45 year old male with history of asthma who presents the ED with acute onset severe right-sided flank pain that began this morning when he woke up. Patient states that he was in his otherwise normal state of health when he went to bed last night and this morning when he woke up he was experiencing severe right flank pain. He states he's been unable to urinate or defecate this morning. Upon presentation to ED patient is profusely vomiting. Emesis is nonbloody and nonbilious. Patient unable to sit still, he states that laying down exacerbates his pain. He denies fevers, chills, melena, hematochezia, testicular pain.  Past Medical History  Diagnosis Date  . Asthma    History reviewed. No pertinent past surgical history. No family history on file. Social History  Substance Use Topics  . Smoking status: Never Smoker   . Smokeless tobacco: None  . Alcohol Use: No    Review of Systems  All other systems reviewed and are negative.     Allergies  Review of patient's allergies indicates no known allergies.  Home Medications   Prior to Admission medications   Medication Sig Start Date End Date Taking? Authorizing Provider  albuterol (PROVENTIL HFA;VENTOLIN HFA) 108 (90 BASE) MCG/ACT inhaler Inhale 2 puffs into the lungs every 6 (six) hours as needed for wheezing or shortness of breath. Use every 6 hours x 4 days then use as needed. 07/20/12   Rodolph Bong, MD  diclofenac (VOLTAREN) 75 MG EC tablet Take 75 mg by mouth 2 (two) times daily.    Historical Provider, MD  levofloxacin (LEVAQUIN) 500 MG tablet Take 1 tablet (500 mg total) by mouth daily. Take for 5 days then stop. 07/20/12    Rodolph Bong, MD  loratadine-pseudoephedrine (CLARITIN-D 12 HOUR) 5-120 MG per tablet Take 1 tablet by mouth 2 (two) times daily. Take for 5 days then stop 07/20/12   Rodolph Bong, MD  predniSONE (DELTASONE) 20 MG tablet Take 2 tablets (40 mg total) by mouth daily before breakfast. Take 2 tablets ( ) daily x 3 days, then 1 tablet ( ) daily x 3 days then stop 07/20/12   Rodolph Bong, MD  Pseudoeph-Doxylamine-DM-APAP (NYQUIL PO) Take 30 mLs by mouth every 6 (six) hours as needed. For cold symptoms    Historical Provider, MD  tiZANidine (ZANAFLEX) 4 MG tablet Take 4 mg by mouth every 6 (six) hours as needed. For muscle spasms    Historical Provider, MD  traMADol (ULTRAM) 50 MG tablet Take 50 mg by mouth every 6 (six) hours as needed. For pain    Historical Provider, MD   BP 135/83 mmHg  Pulse 81  Temp(Src) 97.7 F (36.5 C) (Oral)  Resp 22  Ht  (1.93 m)  Wt 113.399 kg  BMI 30.44 kg/m2  SpO2 94% Physical Exam  Constitutional: He is oriented to person, place, and time. He appears well-developed and well-nourished. He appears distressed.  Pt actively vomiting  HENT:  Head: Normocephalic and atraumatic.  Mouth/Throat: No oropharyngeal exudate.  Eyes: Conjunctivae and EOM are normal. Pupils are equal, round, and reactive to light. Right eye exhibits no discharge. Left eye exhibits no discharge. No scleral icterus.  Cardiovascular: Normal rate, regular  rhythm, normal heart sounds and intact distal pulses.  Exam reveals no gallop and no friction rub.   No murmur heard. Pulmonary/Chest: Effort normal and breath sounds normal. No respiratory distress. He has no wheezes. He has no rales. He exhibits no tenderness.  Abdominal: Soft. Bowel sounds are normal. He exhibits no distension and no mass. There is tenderness. There is no rebound and no guarding.  Severe Right sided flank TTP, CVA tenderness.   Musculoskeletal: Normal range of motion. He exhibits no edema.  Neurological:  He is alert and oriented to person, place, and time.  Skin: Skin is warm and dry. No rash noted. He is not diaphoretic. No erythema. No pallor.  Psychiatric: He has a normal mood and affect. His behavior is normal.  Nursing note and vitals reviewed.   ED Course  Procedures (including critical care time) Labs Review Labs Reviewed  URINALYSIS, ROUTINE W REFLEX MICROSCOPIC (NOT AT [facility]) - Abnormal; Notable for the following:    Hgb urine dipstick MODERATE (*)    All other components within normal limits  CBC - Abnormal; Notable for the following:    WBC 17.3 (*)    All other components within normal limits  COMPREHENSIVE METABOLIC PANEL - Abnormal; Notable for the following:    Glucose, Bld 120 (*)    All other components within normal limits  LIPASE, BLOOD  URINE MICROSCOPIC-ADD ON    Imaging Review Ct Renal Stone Study  12/23/2015  CLINICAL DATA:  Right flank pain for 1 day EXAM: CT ABDOMEN AND PELVIS WITHOUT CONTRAST TECHNIQUE: Multidetector CT imaging of the abdomen and pelvis was performed following the standard protocol without oral or intravenous contrast material administration. COMPARISON:  None. FINDINGS: Lower chest:  Lung bases are clear.  There is a small hiatal hernia. Hepatobiliary: No focal liver lesions are identified on this noncontrast enhanced study. Gallbladder wall is not appreciably thickened. There is no biliary duct dilatation. Pancreas: There is no pancreatic mass or inflammatory focus. Spleen: No splenic lesions are evident. Adrenals/Urinary Tract: Adrenals appear normal bilaterally. There is a 7 x 6 mm mass in the lateral mid left kidney, probably a hyperdense cyst. No other renal masses are identified. There is no hydronephrosis on the left. There is mild hydronephrosis on the right. There is no appreciable renal or ureteral calculus on either side. The right ureter is more prominent than the left ureter in its entirety. The urinary bladder is decompressed with wall  thickness felt to be within normal limits given the degree of decompression. Stomach/Bowel: There is no bowel wall or mesenteric thickening. No bowel obstruction. No free air or portal venous air. Vascular/Lymphatic: No abdominal aortic aneurysm. No vascular lesions are evident on this noncontrast enhanced study. No adenopathy is apparent in the abdomen or pelvis. Reproductive: Prostate and seminal vesicles appear normal in size and contour. There are scattered tiny prostatic calculi. No pelvic mass or pelvic fluid collection. Other: Appendix appears normal. There is a minimal ventral hernia containing only fat. There is no ascites or abscess in the abdomen or pelvis. There is postoperative change in the left inguinal region. Musculoskeletal: There is disc space narrowing with vacuum phenomenon at L5-S1. There is lumbar levoscoliosis. There is no blastic or lytic bone lesion. There is no abdominal wall or intramuscular lesion. IMPRESSION: Slight hydronephrosis and ureterectasis on the right without mass or calculus seen. Suspect recent calculus passage. It should be noted that pyelonephritis is a differential consideration for this appearance on the right. No evidence  of perinephric stranding or renal abscess on this noncontrast enhanced CT examination. 7 x 6 mm probable hyperdense cyst left kidney. This lesion warrants further evaluation. A renal ultrasound in 6-12 months to further evaluate is advised. If there is heightened clinical concern for potential neoplasm, pre and post-contrast renal MRI of the kidneys could be helpful to further assess. There are tiny prostatic calculi. There is a small hiatal hernia. There is a minimal ventral hernia containing only fat. No bowel obstruction.  No abscess.  Appendix region appears normal. Disc space narrowing with vacuum phenomenon L5-S1. Electronically Signed   By: Bretta BangWilliam  Woodruff III M.D.   On: 12/23/2015 10:37   I have personally reviewed and evaluated these  images and lab results as part of my medical decision-making.   EKG Interpretation None      MDM   Final diagnoses:  Nephrolithiasis    Otherwise healthy 45 y.o M presents to the ED with acute onset severe R flank pain, and vomiting. Pt is actively vomiting in ED, appears to be in severe discomfort. Pt unable to sit still. However, is non-toxic, non-septic appearing. Afebrile and all VSS. He was given IV fentanyl and zofran with significant improvement in symptoms. Pt states he is unable to urinate. Concern for kidney stone. CT reveals Slight hydronephrosis and ureterectasis on the right without mass or calculus, suspect recent calculus passed. No evidence of renal abscess seen. There is a cyst present in left kidney which warrants further evaluation of the renal ultrasound in 6-12 months. Leukocytosis present, WBC 17.3. UA shows no sign of infection. Suspect leukocytosis is due to acute pain.   Upon reassessment, patient is resting comfortably in bed with minimal pain. We'll discharge home with Flomax and short course of pain medication. Follow-up with urology recommended. Return precautions outlined in patient discharge instructions.  Lester Kinsman.      Deryn Massengale Tripp Takeem Krotzer, PA-C 12/23/15 1533  Rolland PorterMark James, MD 01/02/16 [phone]

## 2015-12-23 NOTE — ED Notes (Signed)
Bed: WA17 Expected date:  Expected time:  Means of arrival:  Comments: EMS- severe back pain/urinary retention

## 2015-12-23 NOTE — Discharge Instructions (Signed)
Kidney Stones °Kidney stones (urolithiasis) are deposits that form inside your kidneys. The intense pain is caused by the stone moving through the urinary tract. When the stone moves, the ureter goes into spasm around the stone. The stone is usually passed in the urine.  °CAUSES  °· A disorder that makes certain neck glands produce too much parathyroid hormone (primary hyperparathyroidism). °· A buildup of uric acid crystals, similar to gout in your joints. °· Narrowing (stricture) of the ureter. °· A kidney obstruction present at birth (congenital obstruction). °· Previous surgery on the kidney or ureters. °· Numerous kidney infections. °SYMPTOMS  °· Feeling sick to your stomach (nauseous). °· Throwing up (vomiting). °· Blood in the urine (hematuria). °· Pain that usually spreads (radiates) to the groin. °· Frequency or urgency of urination. °DIAGNOSIS  °· Taking a history and physical exam. °· Blood or urine tests. °· CT scan. °· Occasionally, an examination of the inside of the urinary bladder (cystoscopy) is performed. °TREATMENT  °· Observation. °· Increasing your fluid intake. °· Extracorporeal shock wave lithotripsy--This is a noninvasive procedure that uses shock waves to break up kidney stones. °· Surgery may be needed if you have severe pain or persistent obstruction. There are various surgical procedures. Most of the procedures are performed with the use of small instruments. Only small incisions are needed to accommodate these instruments, so recovery time is minimized. °The size, location, and chemical composition are all important variables that will determine the proper choice of action for you. Talk to your health care provider to better understand your situation so that you will minimize the risk of injury to yourself and your kidney.  °HOME CARE INSTRUCTIONS  °· Drink enough water and fluids to keep your urine clear or pale yellow. This will help you to pass the stone or stone fragments. °· Strain  all urine through the provided strainer. Keep all particulate matter and stones for your health care provider to see. The stone causing the pain may be as small as a grain of salt. It is very important to use the strainer each and every time you pass your urine. The collection of your stone will allow your health care provider to analyze it and verify that a stone has actually passed. The stone analysis will often identify what you can do to reduce the incidence of recurrences. °· Only take over-the-counter or prescription medicines for pain, discomfort, or fever as directed by your health care provider. °· Keep all follow-up visits as told by your health care provider. This is important. °· Get follow-up X-rays if required. The absence of pain does not always mean that the stone has passed. It may have only stopped moving. If the urine remains completely obstructed, it can cause loss of kidney function or even complete destruction of the kidney. It is your responsibility to make sure X-rays and follow-ups are completed. Ultrasounds of the kidney can show blockages and the status of the kidney. Ultrasounds are not associated with any radiation and can be performed easily in a matter of minutes. °· Make changes to your daily diet as told by your health care provider. You may be told to: °¨ Limit the amount of salt that you eat. °¨ Eat 5 or more servings of fruits and vegetables each day. °¨ Limit the amount of meat, poultry, fish, and eggs that you eat. °· Collect a 24-hour urine sample as told by your health care provider. You may need to collect another urine sample every 6-12   months. SEEK MEDICAL CARE IF:  You experience pain that is progressive and unresponsive to any pain medicine you have been prescribed. SEEK IMMEDIATE MEDICAL CARE IF:   Pain cannot be controlled with the prescribed medicine.  You have a fever or shaking chills.  The severity or intensity of pain increases over 18 hours and is not  relieved by pain medicine.  You develop a new onset of abdominal pain.  You feel faint or pass out.  You are unable to urinate.   This information is not intended to replace advice given to you by your health care provider. Make sure you discuss any questions you have with your health care provider.  Follow up with urology for re-evaluation. Take flomax daily and pain medication as needed. Encourage adequate hydration, drink plenty of fluids. Return to the ED if you experience severe worsening of your symptoms, inability to urinate, fever, chills.

## 2016-09-03 ENCOUNTER — Emergency Department (HOSPITAL_COMMUNITY)
Admission: EM | Admit: 2016-09-03 | Discharge: 2016-09-03 | Disposition: A | Discharge status: Home / Self Care | Attending: Emergency Medicine | Admitting: Emergency Medicine

## 2016-09-03 ENCOUNTER — Encounter (HOSPITAL_COMMUNITY): Payer: Self-pay | Admitting: Emergency Medicine

## 2016-09-03 DIAGNOSIS — J45909 Unspecified asthma, uncomplicated: Secondary | ICD-10-CM | POA: Diagnosis not present

## 2016-09-03 DIAGNOSIS — K2100 Gastro-esophageal reflux disease with esophagitis, without bleeding: Secondary | ICD-10-CM

## 2016-09-03 DIAGNOSIS — R0602 Shortness of breath: Secondary | ICD-10-CM | POA: Diagnosis present

## 2016-09-03 HISTORY — DX: Gastro-esophageal reflux disease without esophagitis: K21.9

## 2016-09-03 LAB — I-STAT CHEM 8, ED
BUN: 18 mg/dL (ref 6–20)
CALCIUM ION: 1.1 mmol/L — AB (ref 1.15–1.40)
Chloride: 104 mmol/L (ref 101–111)
Creatinine, Ser: 0.9 mg/dL (ref 0.61–1.24)
Glucose, Bld: 107 mg/dL — ABNORMAL HIGH (ref 65–99)
HCT: 47 % (ref 39.0–52.0)
HEMOGLOBIN: 16 g/dL (ref 13.0–17.0)
Potassium: 4.1 mmol/L (ref 3.5–5.1)
SODIUM: 138 mmol/L (ref 135–145)
TCO2: 23 mmol/L (ref 0–100)

## 2016-09-03 NOTE — ED Notes (Signed)
Patient was alert, oriented and stable upon discharge. RN went over AVS and patient had no further questions.  

## 2016-09-03 NOTE — ED Triage Notes (Signed)
Pt is able to finish long sentences, no signs of SOB.   States that his mouth and tongue get dry.  Pt states that he felt like he was having acid reflux and felt the acidity in the back of his throat.

## 2016-09-03 NOTE — Discharge Instructions (Signed)
Please read attached information. If you experience any new or worsening signs or symptoms please return to the emergency room for evaluation. Please follow-up with your primary care provider or specialist as discussed. Please use medication prescribed only as directed and discontinue taking if you have any concerning signs or symptoms.   °

## 2016-09-03 NOTE — ED Provider Notes (Signed)
[facility]-EMERGENCY DEPT Provider Note   CSN: [id number] Arrival date & time: 09/03/16  1951   History   Chief Complaint Chief Complaint  Patient presents with  . Shortness of Breath    HPI Dustin Macias is a 45 y.o. male.  HPI   45 year old male presents today with complaints of shortness of breath. Patient reports that he woke up around 5 PM this evening from a nap and felt like he was choking. He reports acidic taste in his mouth like his indigestion, reports he felt tightness in his throat. Patient has a history of asthma, felt short of breath similar to that, but noted it was higher up in his throat. He notes EMS was called and was given racemic epi which improved his symptoms slightly. Patient notes he went back to his baseline, had no difficulty breathing, shortness of breath, pooling or secretions, or sore throat.   Patient notes he fell back asleep and woke up again with ascitic taste mouth and tightness in his upper airway. Patient denies any significant symptoms presently. He reports he is having very minor dry mouth. Patient reports he is slightly fatigued, notes the last time he felt fatigued his potassium was low.  Patient notes a past medical history of indigestion currently titrated up to 40 mg of Prilosec daily. Patient notes the symptoms have not come on while awake, only when sleeping.    Past Medical History:  Diagnosis Date  . Asthma   . GERD (gastroesophageal reflux disease)     Patient Active Problem List   Diagnosis Date Noted  . Asthma exacerbation 07/20/2012  . Sorethroat 07/20/2012  . Hypokalemia 07/20/2012    Past Surgical History:  Procedure Laterality Date  . FACIAL FRACTURE SURGERY         Home Medications    Prior to Admission medications   Medication Sig Start Date End Date Taking? Authorizing Provider  acetaminophen (TYLENOL) 500 MG tablet Take 1,000-1,500 mg by mouth daily as needed for moderate pain or headache.    Historical  Provider, MD  albuterol (PROVENTIL HFA;VENTOLIN HFA) 108 (90 BASE) MCG/ACT inhaler Inhale 2 puffs into the lungs every 6 (six) hours as needed for wheezing or shortness of breath. Use every 6 hours x 4 days then use as needed. 07/20/12   Rodolph Bong, MD  beclomethasone (QVAR) 80 MCG/ACT inhaler Inhale 1 puff into the lungs 2 (two) times daily. 10/14/15 01/12/16  Historical Provider, MD  calcium carbonate (TUMS - DOSED IN MG ELEMENTAL CALCIUM) 500 MG chewable tablet Chew 2-3 tablets by mouth daily as needed for indigestion or heartburn.    Historical Provider, MD  diclofenac (VOLTAREN) 75 MG EC tablet Take 75 mg by mouth 2 (two) times daily.    Historical Provider, MD  fluticasone (FLONASE) 50 MCG/ACT nasal spray Place 1-2 sprays into both nostrils daily. 10/14/15   Historical Provider, MD  Fluticasone-Salmeterol (ADVAIR) 500-50 MCG/DOSE AEPB Inhale 1 puff into the lungs 2 (two) times daily. 10/14/15 01/12/16  Historical Provider, MD  HYDROcodone-acetaminophen (NORCO/VICODIN) 5-325 MG tablet Take 2 tablets by mouth every 4 (four) hours as needed. 12/23/15   Samantha Tripp Dowless, PA-C  ibuprofen (ADVIL,MOTRIN) 200 MG tablet Take 600 mg by mouth every 6 (six) hours as needed for headache or moderate pain.    Historical Provider, MD  montelukast (SINGULAIR) 10 MG tablet Take 10 mg by mouth at bedtime. 10/14/15 01/12/16  Historical Provider, MD  omeprazole (PRILOSEC) 20 MG capsule Take 20 mg by mouth daily. 10/14/15  Historical Provider, MD  Pseudoeph-Doxylamine-DM-APAP (NYQUIL PO) Take 30 mLs by mouth every 6 (six) hours as needed. For cold symptoms    Historical Provider, MD  tamsulosin (FLOMAX) 0.4 MG CAPS capsule Take 1 capsule (0.4 mg total) by mouth daily. 12/23/15   Samantha Tripp Dowless, PA-C    Family History History reviewed. No pertinent family history.  Social History Social History  Substance Use Topics  . Smoking status: Never Smoker  . Smokeless tobacco: Never Used  . Alcohol use No      Allergies   Patient has no known allergies.   Review of Systems Review of Systems  All other systems reviewed and are negative.   Physical Exam Updated Vital Signs BP 117/84 (BP Location: Right Arm)   Pulse 114   Temp 97.4 F (36.3 C) (Oral)   Resp 20   Ht 6\' 4"  (1.93 m)   Wt 117.9 kg   SpO2 94%   BMI 31.65 kg/m   Physical Exam  Constitutional: He is oriented to person, place, and time. He appears well-developed and well-nourished.  HENT:  Head: Normocephalic and atraumatic.  Mouth/Throat: Uvula is midline, oropharynx is clear and moist and mucous membranes are normal. No oral lesions. No uvula swelling. No oropharyngeal exudate, posterior oropharyngeal edema, posterior oropharyngeal erythema or tonsillar abscesses.  Eyes: Conjunctivae are normal. Pupils are equal, round, and reactive to light. Right eye exhibits no discharge. Left eye exhibits no discharge. No scleral icterus.  Neck: Normal range of motion. No JVD present. No tracheal deviation present.  Pulmonary/Chest: Effort normal. No stridor.  Neurological: He is alert and oriented to person, place, and time. Coordination normal.  Psychiatric: He has a normal mood and affect. His behavior is normal. Judgment and thought content normal.  Nursing note and vitals reviewed.   ED Treatments / Results  Labs (all labs ordered are listed, but only abnormal results are displayed) Labs Reviewed  I-STAT CHEM 8, ED - Abnormal; Notable for the following:       Result Value   Glucose, Bld 107 (*)    Calcium, Ion 1.10 (*)    All other components within normal limits    EKG  EKG Interpretation None       Radiology No results found.  Procedures  Procedures (including critical care time)  Medications Ordered in ED Medications - No data to display   Initial Impression / Assessment and Plan / ED Course  I have reviewed the triage vital signs and the nursing notes.  Pertinent labs & imaging results that were  available during my care of the patient were reviewed by me and considered in my medical decision making (see chart for details).  Clinical Course      Final Clinical Impressions(s) / ED Diagnoses   Final diagnoses:  Gastroesophageal reflux disease with esophagitis    Labs: I-STAT Chem-8  Imaging:  Consults:  Therapeutics:  Discharge Meds:   Assessment/Plan: 45 year old male presents today with complaints of shortness of breath. Throughout my evaluation and stay in the ED he had no episodes of shortness of breath, he has clear lung sounds, no intraoral abnormalities. Patient's presentation is likely due to GERD causing spasm. Patient was offered observation for longer here in the ED, he felt that discharge home would be appropriate and he would return immediately if he had any concerning signs or symptoms. Patient verbalizes understanding and agreement to today's plan had no further questions or concerns at the time discharge     New  Prescriptions Discharge Medication List as of 09/03/2016 10:10 PM       Eyvonne MechanicJeffrey Destony Prevost, PA-C 09/04/16 0016    Lyndal Pulleyaniel Knott, MD 09/04/16 [phone]

## 2016-09-03 NOTE — ED Triage Notes (Signed)
Per EMS: Pt from home.  2nd time EMS has been to his house today.  1st time he called out, he called out for a "choking sensation".  Pt used his albuterol inhaler.  EMS came out and gave him a racemic epi neb d/t sounds of upper airway wheezing.  Pt states he felt better from that and refused transport.  An hour later, had symptoms that included weakness, difficulty breathing, and SOB.  Pt constantly clearing his throat.  Lung sounds are clear throughout.  Pt is not in any objective signs of respiratory distress.  Hx of asthma and takes prilosec.

## 2016-09-24 ENCOUNTER — Emergency Department (HOSPITAL_COMMUNITY)
Admission: EM | Admit: 2016-09-24 | Discharge: 2016-09-24 | Disposition: A | Discharge status: Home / Self Care | Attending: Emergency Medicine | Admitting: Emergency Medicine

## 2016-09-24 ENCOUNTER — Encounter (HOSPITAL_COMMUNITY): Payer: Self-pay | Admitting: Emergency Medicine

## 2016-09-24 DIAGNOSIS — K2900 Acute gastritis without bleeding: Secondary | ICD-10-CM | POA: Diagnosis not present

## 2016-09-24 DIAGNOSIS — J029 Acute pharyngitis, unspecified: Secondary | ICD-10-CM | POA: Diagnosis present

## 2016-09-24 DIAGNOSIS — R1310 Dysphagia, unspecified: Secondary | ICD-10-CM | POA: Insufficient documentation

## 2016-09-24 DIAGNOSIS — J45909 Unspecified asthma, uncomplicated: Secondary | ICD-10-CM | POA: Diagnosis not present

## 2016-09-24 LAB — I-STAT CHEM 8, ED
BUN: 18 mg/dL (ref 6–20)
CALCIUM ION: 1.21 mmol/L (ref 1.15–1.40)
Chloride: 105 mmol/L (ref 101–111)
Creatinine, Ser: 0.9 mg/dL (ref 0.61–1.24)
GLUCOSE: 105 mg/dL — AB (ref 65–99)
HCT: 45 % (ref 39.0–52.0)
HEMOGLOBIN: 15.3 g/dL (ref 13.0–17.0)
Potassium: 4.3 mmol/L (ref 3.5–5.1)
Sodium: 139 mmol/L (ref 135–145)
TCO2: 24 mmol/L (ref 0–100)

## 2016-09-24 NOTE — ED Triage Notes (Signed)
Pt brought in by EMS from home with the complaint of throat discomfort  Pt states he felt like he was having a little bit of a hard time swallowing and breathing  Pt states when he got up he got light headed

## 2016-09-24 NOTE — ED Notes (Addendum)
Pt denies and pain  At this time reports that his throat is dry, and is having episodes of lightheadedness.Pt is alert and oriented x4 and appears without distress.

## 2016-09-24 NOTE — ED Notes (Signed)
Pt given ginger ale.

## 2016-09-24 NOTE — ED Provider Notes (Signed)
[facility]-EMERGENCY DEPT Provider Note   CSN: [id number] Arrival date & time: 09/24/16  0443     History   Chief Complaint Chief Complaint  Patient presents with  . Sore Throat    HPI Dustin Macias is a 46 y.o. male with history of asthma, GERD presents today with complaints of shortness of breath this morning at 4 a.m. Patient reports being seen 2 weeks ago for similar symptoms. He states that he woke up today with a choking sensation and shortness of breath but now denies any shortness of breath but is concerned with this recurrent issue. He states that his symptoms are more at his throat feeling tightness and sensation. He states he does not get these symptoms during the day, only upon awakening, patient reports associated lightheadedness after using his albuterol inhaler and dryness in his throat and mouth. He states he is able to eat and drink without difficulty. He denies trouble swallowing, wheezing, or symptoms with his lungs. He states that he has been using his albuterol every day up to 6 times a day and for almost 3 months now which he states helps as well as propping his head up to sleep. He reports taking all his medications as prescribed including Advair, Singulair, and Prilosec. He reports sometimes getting heartburn throughout the day. He states he hasn't seen his primary care physician in several months.  He states that he has been using his albuterol inhaler for all his life. He states he currently does not use a spacer. Patient denies cough, chest pain, shortness of breath, wheezing, difficulty breathing, accessory muscle use, fevers, chills, congestion. He denies any allergies.   The history is provided by the patient. No language interpreter was used.    Past Medical History:  Diagnosis Date  . Asthma   . GERD (gastroesophageal reflux disease)     Patient Active Problem List   Diagnosis Date Noted  . Asthma exacerbation 07/20/2012  . Sorethroat 07/20/2012  .  Hypokalemia 07/20/2012    Past Surgical History:  Procedure Laterality Date  . FACIAL FRACTURE SURGERY    . HERNIA REPAIR    . WRIST SURGERY         Home Medications    Prior to Admission medications   Medication Sig Start Date End Date Taking? Authorizing Provider  acetaminophen (TYLENOL) 500 MG tablet Take 1,000-1,500 mg by mouth daily as needed for moderate pain or headache.    Historical Provider, MD  albuterol (PROVENTIL HFA;VENTOLIN HFA) 108 (90 BASE) MCG/ACT inhaler Inhale 2 puffs into the lungs every 6 (six) hours as needed for wheezing or shortness of breath. Use every 6 hours x 4 days then use as needed. 07/20/12   Rodolph Bonganiel Thompson V, MD  beclomethasone (QVAR) 80 MCG/ACT inhaler Inhale 1 puff into the lungs 2 (two) times daily. 10/14/15 01/12/16  Historical Provider, MD  calcium carbonate (TUMS - DOSED IN MG ELEMENTAL CALCIUM) 500 MG chewable tablet Chew 2-3 tablets by mouth daily as needed for indigestion or heartburn.    Historical Provider, MD  diclofenac (VOLTAREN) 75 MG EC tablet Take 75 mg by mouth 2 (two) times daily.    Historical Provider, MD  fluticasone (FLONASE) 50 MCG/ACT nasal spray Place 1-2 sprays into both nostrils daily. 10/14/15   Historical Provider, MD  Fluticasone-Salmeterol (ADVAIR) 500-50 MCG/DOSE AEPB Inhale 1 puff into the lungs 2 (two) times daily. 10/14/15 01/12/16  Historical Provider, MD  HYDROcodone-acetaminophen (NORCO/VICODIN) 5-325 MG tablet Take 2 tablets by mouth every 4 (four) hours  as needed. 12/23/15   Samantha Tripp Dowless, PA-C  ibuprofen (ADVIL,MOTRIN) 200 MG tablet Take 600 mg by mouth every 6 (six) hours as needed for headache or moderate pain.    Historical Provider, MD  montelukast (SINGULAIR) 10 MG tablet Take 10 mg by mouth at bedtime. 10/14/15 01/12/16  Historical Provider, MD  omeprazole (PRILOSEC) 20 MG capsule Take 20 mg by mouth daily. 10/14/15   Historical Provider, MD  Pseudoeph-Doxylamine-DM-APAP (NYQUIL PO) Take 30 mLs by mouth every 6  (six) hours as needed. For cold symptoms    Historical Provider, MD  tamsulosin (FLOMAX) 0.4 MG CAPS capsule Take 1 capsule (0.4 mg total) by mouth daily. 12/23/15   Samantha Tripp Dowless, PA-C    Family History History reviewed. No pertinent family history.  Social History Social History  Substance Use Topics  . Smoking status: Never Smoker  . Smokeless tobacco: Never Used  . Alcohol use No     Allergies   Patient has no known allergies.   Review of Systems Review of Systems  Constitutional: Negative for chills and fever.  HENT: Negative for congestion, sore throat and trouble swallowing.        Dry throat. Not painful. No trouble swallowing, breathing, or handling secretions.   Respiratory: Negative for cough and wheezing.        Felt choking sensation and shortness of breath upon awakening. Does not feel the symptoms since this morning upon wakening and using his albuterol. No cough or wheezing.  Cardiovascular: Negative for chest pain.  Gastrointestinal: Negative for vomiting.  Genitourinary: Negative for difficulty urinating and dysuria.  Skin: Negative for rash and wound.  Neurological: Positive for light-headedness.     Physical Exam Updated Vital Signs BP 110/82 (BP Location: Left Arm)   Pulse 75   Temp 98.5 F (36.9 C) (Oral)   Resp 18   Ht 6\' 4"  (1.93 m)   Wt 117.9 kg   SpO2 97%   BMI 31.65 kg/m   Physical Exam  Constitutional: He is oriented to person, place, and time. He appears well-developed and well-nourished.  Well-appearing  HENT:  Head: Normocephalic and atraumatic.  Nose: Nose normal.  Mouth/Throat: Oropharynx is clear and moist. No oropharyngeal exudate.  No evidence of oral candidiasis. No obvious erythema, swelling, or exudates.  Eyes: EOM are normal. Pupils are equal, round, and reactive to light.  Neck: Normal range of motion. Neck supple.  Cardiovascular: Normal rate and normal heart sounds.   Pulmonary/Chest: Effort normal and  breath sounds normal. No respiratory distress. He has no wheezes.  No respiratory distress. No trouble breathing. No accessory muscle usage.  Abdominal: Soft.  Neurological: He is alert and oriented to person, place, and time.  Skin: Skin is warm.  Psychiatric: He has a normal mood and affect. His behavior is normal.  Nursing note and vitals reviewed.    ED Treatments / Results  Labs (all labs ordered are listed, but only abnormal results are displayed) Labs Reviewed  I-STAT CHEM 8, ED - Abnormal; Notable for the following:       Result Value   Glucose, Bld 105 (*)    All other components within normal limits    EKG  EKG Interpretation None       Radiology No results found.  Procedures Procedures (including critical care time)  Medications Ordered in ED Medications - No data to display   Initial Impression / Assessment and Plan / ED Course  I have reviewed the triage vital signs  and the nursing notes.  Pertinent labs & imaging results that were available during my care of the patient were reviewed by me and considered in my medical decision making (see chart for details).    Patient is a 46 year old male presenting with a choking sensation and shortness of breath upon waking this morning. He does not have any symptoms here in the ED. On exam patient is afebrile, vital signs stable, no apparent distress. Heart and lung sounds are clear. Mouth appears without obvious erythema, swelling, exudate. No evidence of oral candidiasis due to not using spacer. We'll order a chem 8 to evaluate for potassium levels and observe here in the Ed. Labs are within normal limits. Potassium at 4.3. This may be due to gastritis symptoms or possible new sleep apnea.  Upon reevaluation patient is calm, NAD, and sleeping. He denies any pain or discomfort. VSS, and afebrile. I feel safe for discharge at this time. Pt agrees with assessment and plan. Pt given strict instructions to schedule  appointment with PCP today to follow up on today's visit. Return precautions given.    Final Clinical Impressions(s) / ED Diagnoses   Final diagnoses:  Acute gastritis without hemorrhage, unspecified gastritis type    New Prescriptions Discharge Medication List as of 09/24/2016  9:15 AM       [address] [place], [place] 09/24/16 1846    Devoria Albe, MD 09/28/16 2304

## 2016-09-24 NOTE — Discharge Instructions (Signed)
Please schedule appointment today with your primary care physician for follow-up for today's visit. Please make sure to take her Prilosec in the morning 30 minutes prior to eating and drinking anything. Please use spacer with your inhaler every time.  SEEK IMMEDIATE MEDICAL CARE IF:  You have black or dark red stools. You vomit blood or material that looks like coffee grounds. You are unable to keep fluids down. Your abdominal pain gets worse. You have a fever. You do not feel better after 1 week. You have any other questions or concerns.

## 2016-10-17 ENCOUNTER — Encounter (HOSPITAL_COMMUNITY): Payer: Self-pay

## 2016-10-17 ENCOUNTER — Emergency Department (HOSPITAL_COMMUNITY)
Admission: EM | Admit: 2016-10-17 | Discharge: 2016-10-17 | Disposition: A | Discharge status: Home / Self Care | Attending: Emergency Medicine | Admitting: Emergency Medicine

## 2016-10-17 DIAGNOSIS — Z79899 Other long term (current) drug therapy: Secondary | ICD-10-CM | POA: Insufficient documentation

## 2016-10-17 DIAGNOSIS — R1310 Dysphagia, unspecified: Secondary | ICD-10-CM | POA: Diagnosis not present

## 2016-10-17 DIAGNOSIS — J45909 Unspecified asthma, uncomplicated: Secondary | ICD-10-CM | POA: Insufficient documentation

## 2016-10-17 MED ORDER — SUCRALFATE 1 G PO TABS: 1.0000 g | ORAL_TABLET | Freq: Three times a day (TID) | ORAL | 0 refills | Status: DC

## 2016-10-17 MED ORDER — GI COCKTAIL ~~LOC~~
30.0000 mL | Freq: Once | ORAL | Status: AC
  Administered 2016-10-17: 30 mL via ORAL
  Filled 2016-10-17: qty 30

## 2016-10-17 NOTE — Discharge Instructions (Signed)
You need to follow up with ENT. It appears your primary care physician has already sent a referral for you. If you have not heard from anyone in a week, please call your primary physician and inquire about the status of your referral. Return to ER for new or worsening symptoms, any additional concerns.

## 2016-10-17 NOTE — ED Triage Notes (Signed)
For one month difficulty swallowing off and on been referred to ENT specialist.  Seen here before for same.  No drooling clear speech noted.

## 2016-10-17 NOTE — ED Provider Notes (Signed)
[facility]-EMER[facility] DEPT Provider Note   CSN: [id number] Arrival date & time: 10/17/16  0129     History   Chief Complaint Chief Complaint  Patient presents with  . Dysphagia    states difficult swallowing started PTA to ER.    HPI Dustin Macias is a 46 y.o. male.  The history is provided by the patient and medical records. No language interpreter was used.   Dustin Macias is a 46 y.o. male  with a PMH of GERD and asthma who presents to the Emergency Department complaining of difficulty swallowing which began tonight. He has been experiencing similar symptoms off and on for the last 6 weeks. He has been seen in ER for this previously and informed me that his workup is always normal. He followed up with PCP who started him on omeprazole and believed sxs were 2/2 his GERD. He has been referred to ENT but does not have an appointment scheduled yet. No oral swelling or difficulty breathing. No sore throat, cough, congestion, fever.   Past Medical History:  Diagnosis Date  . Asthma   . GERD (gastroesophageal reflux disease)     Patient Active Problem List   Diagnosis Date Noted  . Asthma exacerbation 07/20/2012  . Sorethroat 07/20/2012  . Hypokalemia 07/20/2012    Past Surgical History:  Procedure Laterality Date  . FACIAL FRACTURE SURGERY    . HERNIA REPAIR    . WRIST SURGERY         Home Medications    Prior to Admission medications   Medication Sig Start Date End Date Taking? Authorizing Provider  acetaminophen (TYLENOL) 500 MG tablet Take 1,000-1,500 mg by mouth daily as needed for moderate pain or headache.    Historical Provider, MD  albuterol (PROVENTIL HFA;VENTOLIN HFA) 108 (90 BASE) MCG/ACT inhaler Inhale 2 puffs into the lungs every 6 (six) hours as needed for wheezing or shortness of breath. Use every 6 hours x 4 days then use as needed. 07/20/12   Rodolph Bong, MD  beclomethasone (QVAR) 80 MCG/ACT inhaler Inhale 1 puff into the lungs 2 (two) times daily.  10/14/15 01/12/16  Historical Provider, MD  calcium carbonate (TUMS - DOSED IN MG ELEMENTAL CALCIUM) 500 MG chewable tablet Chew 2-3 tablets by mouth daily as needed for indigestion or heartburn.    Historical Provider, MD  diclofenac (VOLTAREN) 75 MG EC tablet Take 75 mg by mouth 2 (two) times daily.    Historical Provider, MD  fluticasone (FLONASE) 50 MCG/ACT nasal spray Place 1-2 sprays into both nostrils daily. 10/14/15   Historical Provider, MD  Fluticasone-Salmeterol (ADVAIR) 500-50 MCG/DOSE AEPB Inhale 1 puff into the lungs 2 (two) times daily. 10/14/15 01/12/16  Historical Provider, MD  HYDROcodone-acetaminophen (NORCO/VICODIN) 5-325 MG tablet Take 2 tablets by mouth every 4 (four) hours as needed. 12/23/15   Samantha Tripp Dowless, PA-C  ibuprofen (ADVIL,MOTRIN) 200 MG tablet Take 600 mg by mouth every 6 (six) hours as needed for headache or moderate pain.    Historical Provider, MD  montelukast (SINGULAIR) 10 MG tablet Take 10 mg by mouth at bedtime. 10/14/15 01/12/16  Historical Provider, MD  omeprazole (PRILOSEC) 20 MG capsule Take 20 mg by mouth daily. 10/14/15   Historical Provider, MD  Pseudoeph-Doxylamine-DM-APAP (NYQUIL PO) Take 30 mLs by mouth every 6 (six) hours as needed. For cold symptoms    Historical Provider, MD  sucralfate (CARAFATE) 1 g tablet Take 1 tablet (1 g total) by mouth 4 (four) times daily -  with meals and  at bedtime. 10/17/16   Chase Picket Maizey Menendez, PA-C  tamsulosin (FLOMAX) 0.4 MG CAPS capsule Take 1 capsule (0.4 mg total) by mouth daily. 12/23/15   Samantha Tripp Dowless, PA-C    Family History History reviewed. No pertinent family history.  Social History Social History  Substance Use Topics  . Smoking status: Never Smoker  . Smokeless tobacco: Never Used  . Alcohol use No     Allergies   Patient has no known allergies.   Review of Systems Review of Systems  Constitutional: Negative for chills and fever.  HENT: Positive for trouble swallowing. Negative for  congestion and sore throat.   Eyes: Negative for visual disturbance.  Respiratory: Negative for cough and shortness of breath.   Cardiovascular: Negative for chest pain.  Gastrointestinal: Negative for abdominal pain, nausea and vomiting.  Genitourinary: Negative for dysuria.  Musculoskeletal: Negative for back pain and neck pain.  Skin: Negative for rash.  Neurological: Negative for headaches.     Physical Exam Updated Vital Signs BP 112/86 (BP Location: Left Arm)   Pulse 64   Temp 97.8 F (36.6 C) (Oral)   Resp 19   Ht 6\' 2"  (1.88 m)   Wt 117.9 kg   SpO2 93%   BMI 33.38 kg/m   Physical Exam  Constitutional: He is oriented to person, place, and time. He appears well-developed and well-nourished. No distress.  HENT:  Head: Normocephalic and atraumatic.  Airway patent. OP clear and moist.   Cardiovascular: Normal rate, regular rhythm and normal heart sounds.   No murmur heard. Pulmonary/Chest: Effort normal and breath sounds normal. No respiratory distress. He has no wheezes. He has no rales.  Speaking in full sentences with no difficulty.   Abdominal: Soft. He exhibits no distension. There is no tenderness.  Musculoskeletal: Normal range of motion.  Neurological: He is alert and oriented to person, place, and time.  Skin: Skin is warm and dry.  Nursing note and vitals reviewed.    ED Treatments / Results  Labs (all labs ordered are listed, but only abnormal results are displayed) Labs Reviewed - No data to display  EKG  EKG Interpretation None       Radiology No results found.  Procedures Procedures (including critical care time)  Medications Ordered in ED Medications  gi cocktail (Maalox,Lidocaine,Donnatal) (30 mLs Oral Given 10/17/16 0400)     Initial Impression / Assessment and Plan / ED Course  I have reviewed the triage vital signs and the nursing notes.  Pertinent labs & imaging results that were available during my care of the patient were  reviewed by me and considered in my medical decision making (see chart for details).    Dustin Macias is a 46 y.o. male who presents to ED for difficulty swallowing. Seen in ED and PCP for same. On exam, patient is speaking in full sentences without difficulty, handling secretions well, 96% O2 on RA, airway patent and lungs CTA bilaterally. GI cocktail given and patient swallowed without difficulty per nursing staff. On re-evaluation, patient states that he cannot swallow. I personally gave patient cup of water. He took multiple small sips and swallowed all fluids. He successfully drank one full cup of water. Again re-evaluated and states he feels improved. Strongly encouraged ENT follow up. Rx for carafate given as well. Return precautions discussed and all questions answered.   Patient discussed with Dr. Nicanor Alcon who agrees with treatment plan.    Final Clinical Impressions(s) / ED Diagnoses   Final diagnoses:  Dysphagia, unspecified type    New Prescriptions New Prescriptions   SUCRALFATE (CARAFATE) 1 G TABLET    Take 1 tablet (1 g total) by mouth 4 (four) times daily -  with meals and at bedtime.     [facility] Children[facility]S CenterJaime Pilcher Alee Gressman, PA-C 10/17/16 [id number]    April Palumbo, MD 10/17/16 [id number]

## 2016-11-22 ENCOUNTER — Encounter: Payer: Self-pay | Admitting: Allergy

## 2016-11-22 ENCOUNTER — Ambulatory Visit (INDEPENDENT_AMBULATORY_CARE_PROVIDER_SITE_OTHER): Admitting: Allergy

## 2016-11-22 VITALS — BP 108/70 | HR 82 | Temp 97.8°F | Resp 16 | Ht 75.0 in | Wt 253.0 lb

## 2016-11-22 DIAGNOSIS — J3089 Other allergic rhinitis: Secondary | ICD-10-CM

## 2016-11-22 DIAGNOSIS — K219 Gastro-esophageal reflux disease without esophagitis: Secondary | ICD-10-CM

## 2016-11-22 DIAGNOSIS — J4550 Severe persistent asthma, uncomplicated: Secondary | ICD-10-CM

## 2016-11-22 DIAGNOSIS — R1310 Dysphagia, unspecified: Secondary | ICD-10-CM

## 2016-11-22 MED ORDER — SUCRALFATE 1 G PO TABS: 1.0000 g | ORAL_TABLET | Freq: Three times a day (TID) | ORAL | 3 refills | Status: DC

## 2016-11-22 NOTE — Patient Instructions (Addendum)
Asthma     - continue current asthma regimen: Advair diskus 1 puffs twice a day, spiriva 1 puff daily, prednisone 5mg  daily, albuterol inhaler as needed for cough, wheeze, difficulty breathing.     - will obtain CBC w diff to assess for eosinophilia    - you may qualify for asthma medications like Nucala or Fasenra injectable medications to help control your asthma and decrease or get you off of daily prednisone  Asthma control goals:   Full participation in all desired activities (may need albuterol before activity)  Albuterol use two time or less a week on average (not counting use with activity)  Cough interfering with sleep two time or less a month  Oral steroids no more than once a year  No hospitalizations  Reflux/dysphagia     - continue Prilosec daily      - recommend either ENT or GI evaluation     - we will resend your Carafate  Allergies     - continue flonase 1-2 sprays daily as needed for nasal congestion/drainage     - will obtain environmental allergen panel  Follow-up 4 months

## 2016-11-22 NOTE — Progress Notes (Signed)
New Patient Note  RE: Dustin Macias MRN: [id number] DOB: [birth year] Date of Office Visit: 11/22/2016  Referring provider: Tomma Lightning, MD Primary care provider: Tomma Lightning, MD  Chief Complaint: asthma/fmla paperwork  History of present illness: Dustin Macias is a 46 y.o. male presenting today for consultation for asthma and to have his FMLA paperwork filled out.  He reports his PCP is to fill out paperwork but they have since stopped. He reports he needs FMLA due to his asthma.  He was diagnosed with asthma in 2004.  He was medically retired from [employer] in 2006 due to his asthma.  He has flares of his asthma around every 3 months or so.   Asthma symptoms are worse during winter.  He is on spiriva 1 puff daily, advair diskus 1 puff twice a day, and prednisone 5mg  daily and singulair daily.   He is has been on prednisone for the past year.  He has not had to increase this dose over the past year.   He has been hospitalized for asthma last about a year ago.  He uses albuterol 1-2 x daily.  He has nighttime awakenings about once a month.  He reports he has had less flare ups since being on prednisone.  He follows with Dr. Jerre Simon in pulmonology with [facility] who was trying to get a bronchial thermoplasty improvement with his insurance.  He misses work about 4-5 days a month due to his asthma symptoms.  He reports his biggest symptom with his asthma is wheezing.  He reports occasional nasal symptoms of congestion mostly with heavy pollen activity.  Uses flonase about twice a week.    For his GERD he takes prilosec. He has had some issues with dysphagia and seen in the ED for this.  He has felt like he couldn't swallow.  He has been referred to see ENT but has yet to make appointment at this time.    He has no history of food allergy or current concerns  No history of eczema.     Review of systems: Review of Systems  Constitutional: Negative for chills, fever and malaise/fatigue.  HENT:  Negative for congestion, ear pain, nosebleeds, sinus pain, sore throat and tinnitus.   Eyes: Negative for discharge and redness.  Respiratory: Positive for wheezing. Negative for cough, sputum production and shortness of breath.   Cardiovascular: Negative for chest pain.  Gastrointestinal: Positive for heartburn. Negative for abdominal pain, nausea and vomiting.  Musculoskeletal: Negative for joint pain and myalgias.  Skin: Negative for itching and rash.  Neurological: Negative for headaches.    All other systems negative unless noted above in HPI  Past medical history: Past Medical History:  Diagnosis Date  . Asthma   . Difficulty swallowing   . GERD (gastroesophageal reflux disease)     Past surgical history: Past Surgical History:  Procedure Laterality Date  . ADENOIDECTOMY    . FACIAL FRACTURE SURGERY    . HERNIA REPAIR    . TONSILLECTOMY    . WRIST SURGERY      Family history:  Family History  Problem Relation Age of Onset  . Allergic rhinitis Neg Hx   . Angioedema Neg Hx   . Asthma Neg Hx   . Eczema Neg Hx   . Immunodeficiency Neg Hx   . Urticaria Neg Hx   . Atopy Neg Hx     Social history:  . She was in a town home with carpeting in the bedroom with  electric heating and central cooling. There is a cat and a dog in the home. There is no concern for water damage or mildew or roaches in the home. 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 He is retired from [employer]. He quit smoking in February 2000 and used to smoke half a pack per day for 10 years.  Medication List: Allergies as of 11/22/2016   No Known Allergies     Medication List       Accurate as of 11/22/16 10:40 AM. Always use your most recent med list.          acetaminophen 500 MG tablet Commonly known as:  TYLENOL Take 1,000-1,500 mg by mouth daily as needed for moderate pain or headache.   albuterol 108 (90 Base) MCG/ACT inhaler Commonly known as:  PROVENTIL HFA;VENTOLIN  HFA Inhale 2 puffs into the lungs every 6 (six) hours as needed for wheezing or shortness of breath. Use every 6 hours x 4 days then use as needed.   calcium carbonate 500 MG chewable tablet Commonly known as:  TUMS - dosed in mg elemental calcium Chew 2-3 tablets by mouth daily as needed for indigestion or heartburn.   diclofenac 75 MG EC tablet Commonly known as:  VOLTAREN Take 75 mg by mouth 2 (two) times daily.   fluticasone 50 MCG/ACT nasal spray Commonly known as:  FLONASE Place 1-2 sprays into both nostrils daily.   Fluticasone-Salmeterol 500-50 MCG/DOSE Aepb Commonly known as:  ADVAIR Inhale 1 puff into the lungs 2 (two) times daily.   ibuprofen 200 MG tablet Commonly known as:  ADVIL,MOTRIN Take 600 mg by mouth every 6 (six) hours as needed for headache or moderate pain.   montelukast 10 MG tablet Commonly known as:  SINGULAIR Take 10 mg by mouth at bedtime.   NYQUIL PO Take 30 mLs by mouth every 6 (six) hours as needed. For cold symptoms   omeprazole 20 MG capsule Commonly known as:  PRILOSEC Take 20 mg by mouth daily.   sucralfate 1 g tablet Commonly known as:  CARAFATE Take 1 tablet (1 g total) by mouth 4 (four) times daily - after meals and at bedtime.   Tiotropium Bromide Monohydrate 1.25 MCG/ACT Aers Inhale into the lungs.       Known medication allergies: No Known Allergies   Physical examination: Blood pressure 108/70, pulse 82, temperature 97.8 F (36.6 C), temperature source Oral, resp. rate 16, height 6\' 3"  (1.905 m), weight 253 lb (114.8 kg), SpO2 96 %.  General: Alert, interactive, in no acute distress. HEENT: TMs pearly gray, turbinates minimally edematous without discharge, post-pharynx non erythematous. Neck: Supple without lymphadenopathy. Lungs: Clear to auscultation without wheezing, rhonchi or rales. {no increased work of breathing. CV: Normal S1, S2 without murmurs. Abdomen: Nondistended, nontender. Skin: Warm and dry, without  lesions or rashes. Extremities:  No clubbing, cyanosis or edema. Neuro:   Grossly intact.  Diagnositics/Labs:  Spirometry: FEV1: 3.22L  66%, FVC: 4.51L  75%, ratio consistent with Restrictive type pattern  Assessment and plan:   Asthma, Severe persistent     - continue current asthma regimen: Advair diskus 1 puffs twice a day, spiriva 1 puff daily, prednisone 5mg  daily, albuterol inhaler as needed for cough, wheeze, difficulty breathing.     - will obtain CBC w diff to assess for eosinophilia however to expect it likely will be depressed given prednisone use    - you may qualify for asthma medications like Nucala or Harrington Challenger  injectable medications to help control your asthma and decrease or get you off of daily prednisone  Asthma control goals:   Full participation in all desired activities (may need albuterol before activity)  Albuterol use two time or less a week on average (not counting use with activity)  Cough interfering with sleep two time or less a month  Oral steroids no more than once a year  No hospitalizations  Reflux/dysphagia     - continue Prilosec daily      - recommend either ENT or GI evaluation     - we will resend your Carafate  Allergic rhinitis     - continue flonase 1-2 sprays daily as needed for nasal congestion/drainage     - will obtain environmental allergen panel  Follow-up 4 months   I appreciate the opportunity to take part in Nishawn's care. Please do not hesitate to contact me with questions.  Sincerely,   Margo AyeShaylar Padgett, MD Allergy/Immunology [facility]

## 2016-12-12 IMAGING — CT CT RENAL STONE PROTOCOL
2 of 3 series · 15 of 46 positions shown, 17 images · non-contrast
Comparison: None.

CLINICAL DATA: Right flank pain for 1 day

EXAM:
CT ABDOMEN AND PELVIS WITHOUT CONTRAST
TECHNIQUE: Multidetector CT imaging of the abdomen and pelvis was performed
following the standard protocol without oral or intravenous contrast
material administration.

[Series 4: lung · axial · 0.91mm/px · z∈[+198,+274]mm · 12 of 44 slices shown, 14 images]
[im 3/44  soft-tissue]
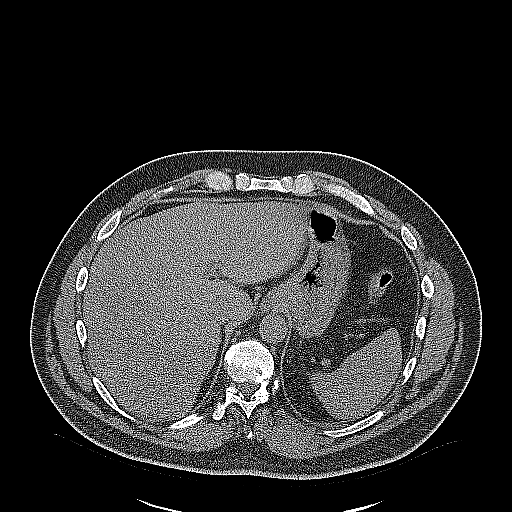
[im 3/44  bone]
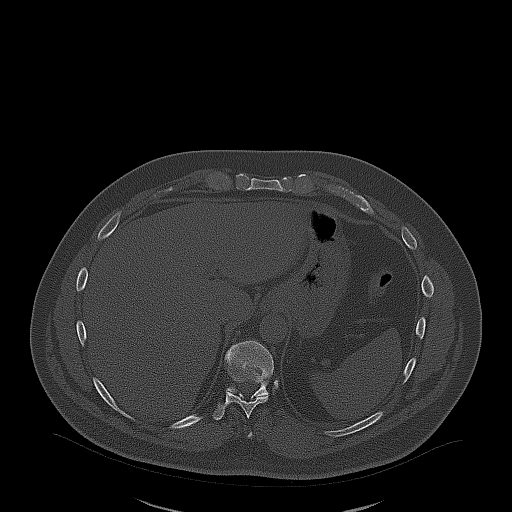
[im 6/44  soft-tissue]
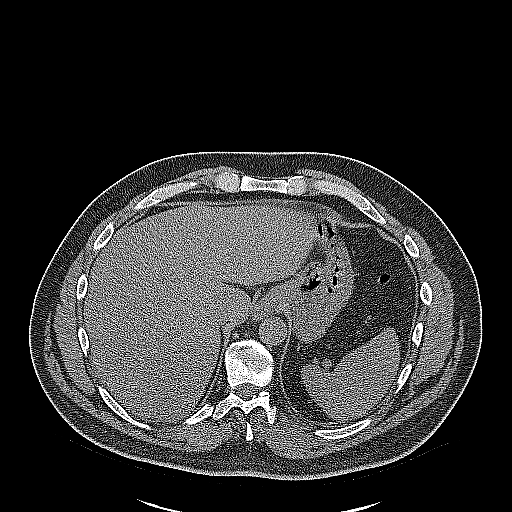
[im 10/44  soft-tissue]
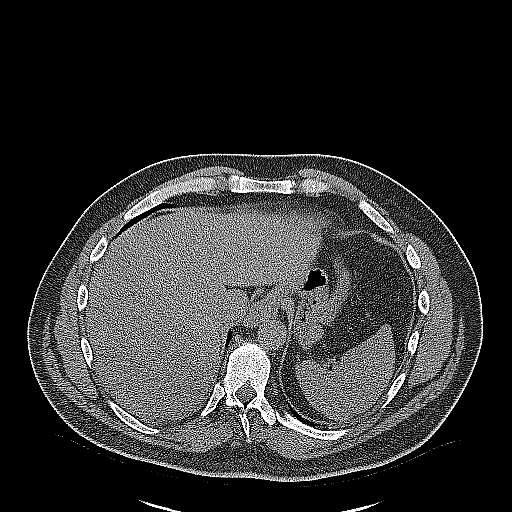
[im 13/44  soft-tissue]
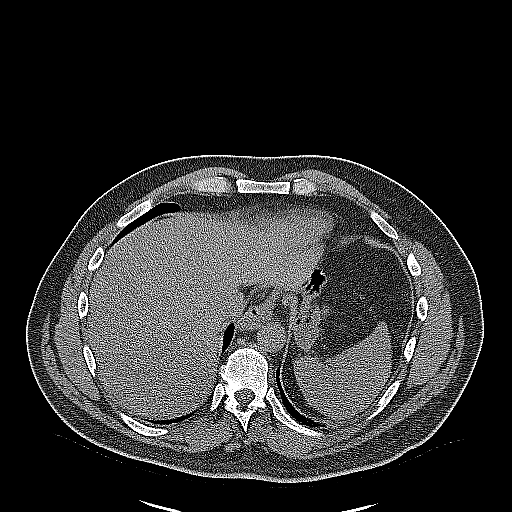
[im 17/44  soft-tissue]
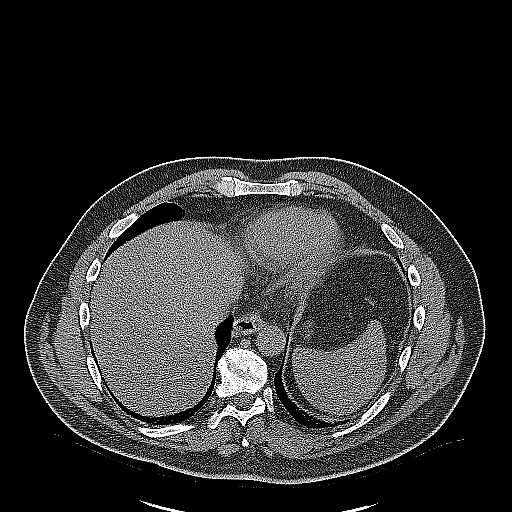
[im 20/44  soft-tissue]
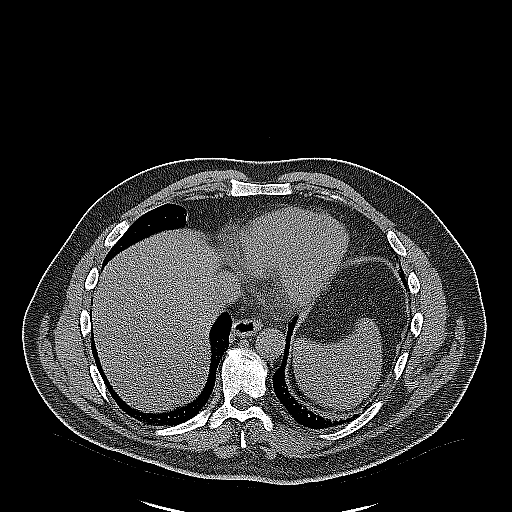
[im 24/44  soft-tissue]
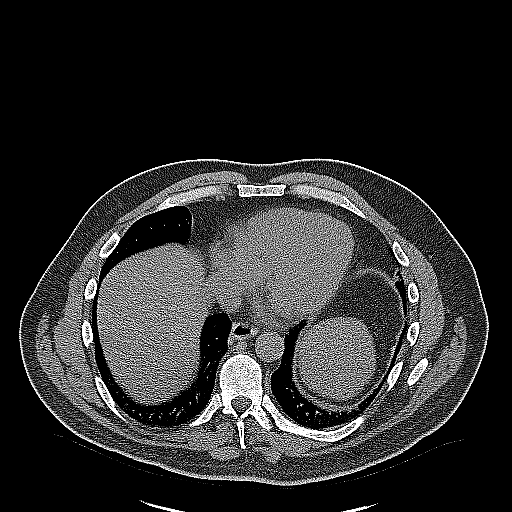
[im 27/44  soft-tissue]
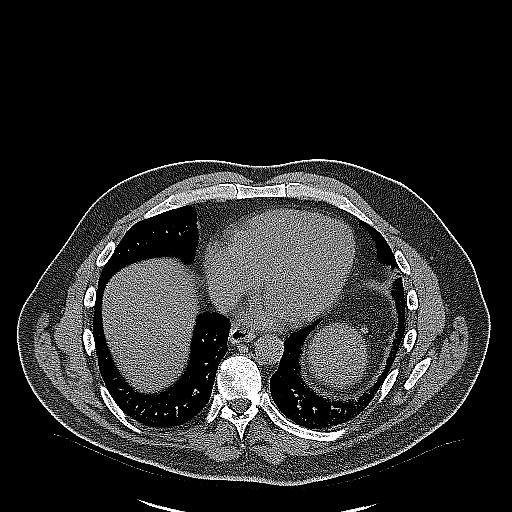
[im 31/44  soft-tissue]
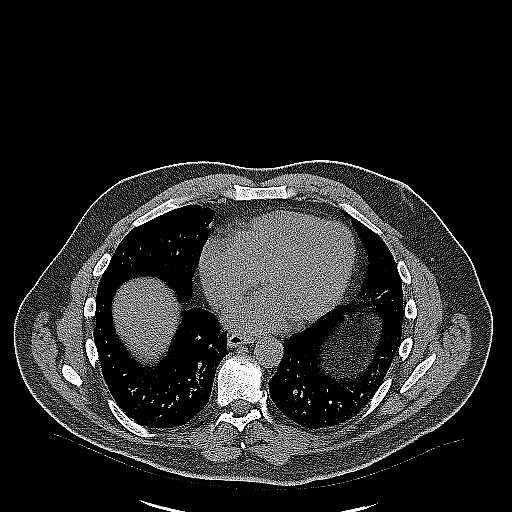
[im 31/44  bone]
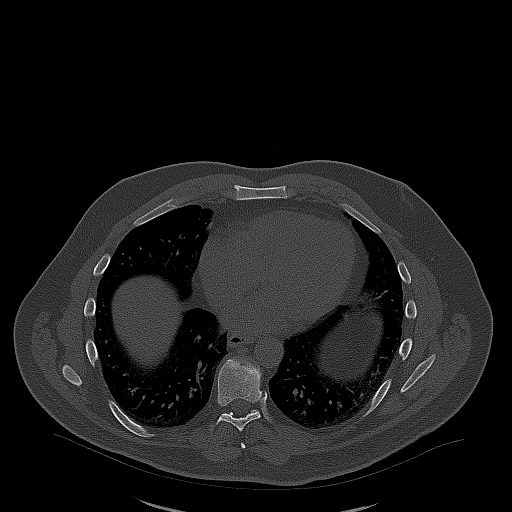
[im 34/44  soft-tissue]
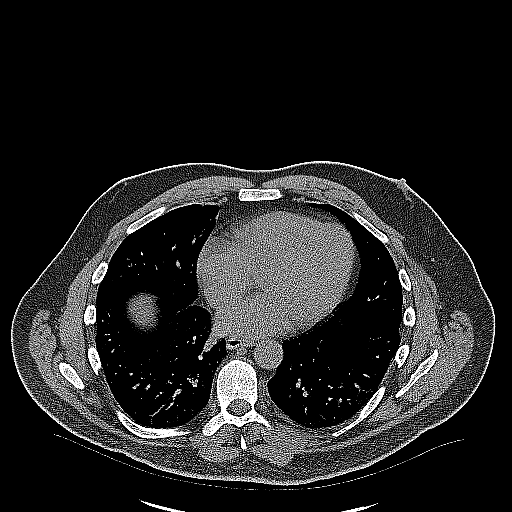
[im 38/44  soft-tissue]
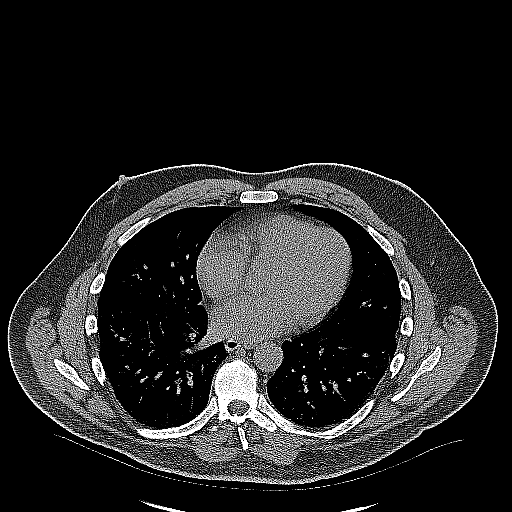
[im 41/44  soft-tissue]
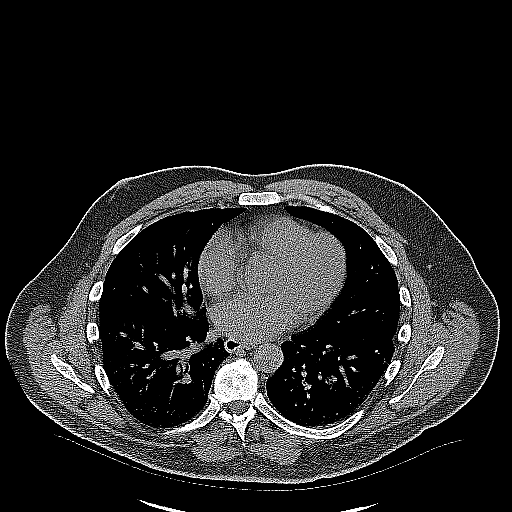

[Series 5: coronal · coronal · 0.87mm/px · 3 of 176 slices shown]
[im 59/176  soft-tissue]
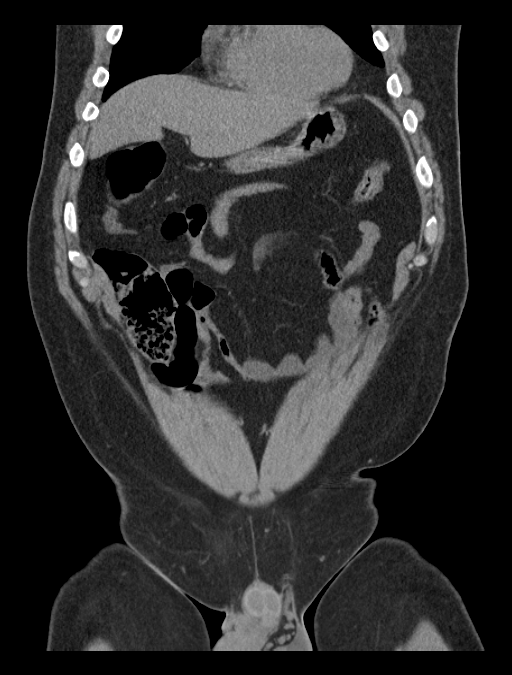
[im 78/176  soft-tissue]
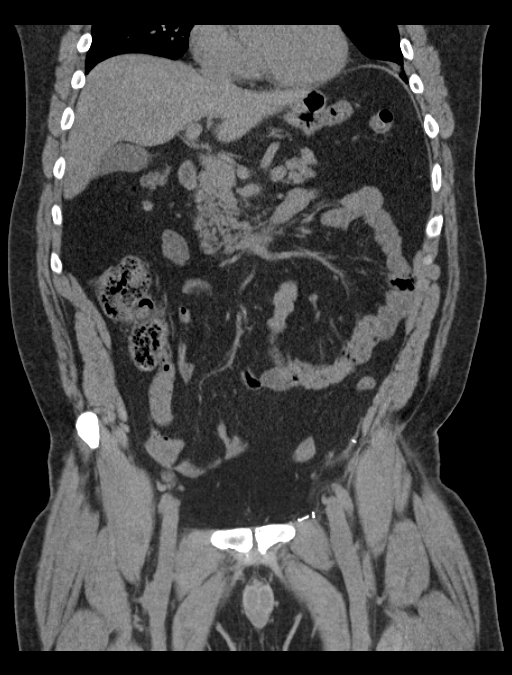
[im 98/176  soft-tissue]
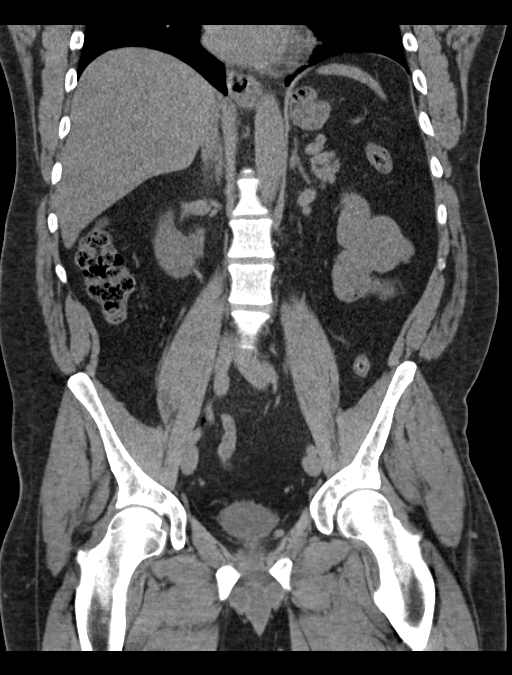

[15 of 46 positions shown; findings below may reference images not displayed]

FINDINGS: Lower chest:  Lung bases are clear.  There is a small hiatal hernia.

Hepatobiliary: No focal liver lesions are identified on this
noncontrast enhanced study. Gallbladder wall is not appreciably
thickened. There is no biliary duct dilatation.

Pancreas: There is no pancreatic mass or inflammatory focus.

Spleen: No splenic lesions are evident.

Adrenals/Urinary Tract: Adrenals appear normal bilaterally. There is
a 7 x 6 mm mass in the lateral mid left kidney, probably a
hyperdense cyst. No other renal masses are identified. There is no
hydronephrosis on the left. There is mild hydronephrosis on the
right. There is no appreciable renal or ureteral calculus on either
side. The right ureter is more prominent than the left ureter in its
entirety. The urinary bladder is decompressed with wall thickness
felt to be within normal limits given the degree of decompression.

Stomach/Bowel: There is no bowel wall or mesenteric thickening. No
bowel obstruction. No free air or portal venous air.

Vascular/Lymphatic: No abdominal aortic aneurysm. No vascular
lesions are evident on this noncontrast enhanced study. No
adenopathy is apparent in the abdomen or pelvis.

Reproductive: Prostate and seminal vesicles appear normal in size
and contour. There are scattered tiny prostatic calculi. No pelvic
mass or pelvic fluid collection.

Other: Appendix appears normal. There is a minimal ventral hernia
containing only fat. There is no ascites or abscess in the abdomen
or pelvis. There is postoperative change in the left inguinal
region.

Musculoskeletal: There is disc space narrowing with vacuum
phenomenon at L5-S1. There is lumbar levoscoliosis. There is no
blastic or lytic bone lesion. There is no abdominal wall or
intramuscular lesion.
IMPRESSION: Slight hydronephrosis and ureterectasis on the right without mass or
calculus seen. Suspect recent calculus passage. It should be noted
that pyelonephritis is a differential consideration for this
appearance on the right. No evidence of perinephric stranding or
renal abscess on this noncontrast enhanced CT examination.

7 x 6 mm probable hyperdense cyst left kidney. This lesion warrants
further evaluation. A renal ultrasound in 6-12 months to further
evaluate is advised. If there is heightened clinical concern for
potential neoplasm, pre and post-contrast renal MRI of the kidneys
could be helpful to further assess.

There are tiny prostatic calculi.

There is a small hiatal hernia. There is a minimal ventral hernia
containing only fat.

No bowel obstruction.  No abscess.  Appendix region appears normal.

Disc space narrowing with vacuum phenomenon L5-S1.

## 2017-03-24 ENCOUNTER — Ambulatory Visit (INDEPENDENT_AMBULATORY_CARE_PROVIDER_SITE_OTHER): Admitting: Allergy

## 2017-03-24 ENCOUNTER — Encounter: Payer: Self-pay | Admitting: Allergy

## 2017-03-24 VITALS — BP 126/80 | HR 100 | Resp 20

## 2017-03-24 DIAGNOSIS — J4551 Severe persistent asthma with (acute) exacerbation: Secondary | ICD-10-CM

## 2017-03-24 DIAGNOSIS — K219 Gastro-esophageal reflux disease without esophagitis: Secondary | ICD-10-CM | POA: Diagnosis not present

## 2017-03-24 DIAGNOSIS — J3089 Other allergic rhinitis: Secondary | ICD-10-CM | POA: Diagnosis not present

## 2017-03-24 NOTE — Progress Notes (Signed)
Follow-up Note  RE: Dustin Macias MRN: [id number] DOB: 01-May-1971 Date of Office Visit: 03/24/2017   History of present illness: Dustin Macias is a 46 y.o. male presenting today for follow-up of asthma, allergies and reflux.  He was last seen in the office on 11/22/16 by myself.   He reports symptoms of SOB for past 2-3 weeks with wheezing and coughing and endorses mild pleuritic pain.  He has needed to use albuterol daily and sometimes up to 4 times a day.  He is having nighttime awakenings.  He also feels he has been having some phlegm but not able to cough anything up.  He denies fevers/chills.  He continues to use his daily Advair 1 puff twice a day, spiriva and daily prednisone 5mg .   He went to urgent care last night and was treated with Duoneb treatment and received decadron injection.  He was also given a medrol pak but he has not picked this up from the pharmacy.  He does not feel much improved today.   He was ordered labs to obtain CBC and environmental allergen panel after his last visit which he has not done.  He continues to take his other routine medications of omeprazole, carafate, and flonase.  He denies any significant nasal congestion or reflux symptoms.       Review of systems: Review of Systems  Constitutional: Negative for chills, fever and malaise/fatigue.  HENT: Negative for congestion, ear discharge, ear pain, nosebleeds, sinus pain, sore throat and tinnitus.   Eyes: Negative for discharge and redness.  Respiratory: Positive for cough, shortness of breath and wheezing. Negative for hemoptysis.   Cardiovascular: Positive for chest pain (pleuritic).  Gastrointestinal: Negative for abdominal pain, constipation, diarrhea, heartburn, nausea and vomiting.  Musculoskeletal: Negative for joint pain.  Skin: Negative for itching and rash.  Neurological: Negative for headaches.    All other systems negative unless noted above in HPI  Past  medical/social/surgical/family history have been reviewed and are unchanged unless specifically indicated below.  No changes  Medication List: Allergies as of 03/24/2017   No Known Allergies     Medication List       Accurate as of 03/24/17 12:23 PM. Always use your most recent med list.          acetaminophen 500 MG tablet Commonly known as:  TYLENOL Take 1,000-1,500 mg by mouth daily as needed for moderate pain or headache.   albuterol 108 (90 Base) MCG/ACT inhaler Commonly known as:  PROVENTIL HFA;VENTOLIN HFA Inhale 2 puffs into the lungs every 6 (six) hours as needed for wheezing or shortness of breath. Use every 6 hours x 4 days then use as needed.   calcium carbonate 500 MG chewable tablet Commonly known as:  TUMS - dosed in mg elemental calcium Chew 2-3 tablets by mouth daily as needed for indigestion or heartburn.   fluticasone 50 MCG/ACT nasal spray Commonly known as:  FLONASE Place 1-2 sprays into both nostrils daily.   Fluticasone-Salmeterol 500-50 MCG/DOSE Aepb Commonly known as:  ADVAIR Inhale 1 puff into the lungs 2 (two) times daily.   ibuprofen 200 MG tablet Commonly known as:  ADVIL,MOTRIN Take 600 mg by mouth every 6 (six) hours as needed for headache or moderate pain.   methylPREDNISolone 4 MG Tbpk tablet Commonly known as:  MEDROL DOSEPAK   montelukast 10 MG tablet Commonly known as:  SINGULAIR Take 10 mg by mouth at bedtime.   NYQUIL PO Take 30 mLs by mouth every 6 (  six) hours as needed. For cold symptoms   omeprazole 20 MG capsule Commonly known as:  PRILOSEC Take 20 mg by mouth daily.   sucralfate 1 g tablet Commonly known as:  CARAFATE Take 1 tablet (1 g total) by mouth 4 (four) times daily - after meals and at bedtime.   Tiotropium Bromide Monohydrate 1.25 MCG/ACT Aers Inhale into the lungs.       Known medication allergies: No Known Allergies   Physical examination: Blood pressure 126/80, pulse 100, resp. rate 20, SpO2 94  %.  General: Alert, interactive, in no acute distress. HEENT: PERRLA, TMs pearly gray, turbinates minimally edematous without discharge, post-pharynx non erythematous. Neck: Supple without lymphadenopathy. Lungs: Decreased breath sounds bilaterally without wheezing, rhonchi or rales. {no increased work of breathing.  After duoneb improved aeration with wheeze heard in upper lung fields CV: Normal S1, S2 without murmurs. Abdomen: Nondistended, nontender. Skin: Warm and dry, without lesions or rashes. Extremities:  No clubbing, cyanosis or edema. Neuro:   Grossly intact.  Diagnositics/Labs:  Spirometry: pt declined  Assessment and plan:   Severe persistent asthma with exacerbation    - continue current asthma regimen: Advair diskus 1 puffs twice a day, spiriva 1 puff daily, prednisone 5mg  daily, albuterol inhaler as needed for cough, wheeze, difficulty breathing.     - take prednisone 20mg  twice a day for the next 5 days then resume daily 5 mg dose.      - Take Mucinex 1200mg  daily with plenty of water     - obtain CXR to assess for underlying infection or abnormality    - please obtain labwork for CBC w diff to assess for eosinophilia    - you will likely qualify for asthma medications like Nucala or Fasenra monthly injectable medications to help control your asthma and decrease or get you off of daily prednisone.  These medications are asthma controlling medications.    Asthma control goals:   Full participation in all desired activities (may need albuterol before activity)  Albuterol use two time or less a week on average (not counting use with activity)  Cough interfering with sleep two time or less a month  Oral steroids no more than once a year  No hospitalizations  Reflux/dysphagia     - continue Prilosec daily      - continue Carafate  Allergic rhinitis     - continue flonase 1-2 sprays daily as needed for nasal congestion/drainage     - will obtain environmental  allergen panel -- advised of importance of having this done as may impact which biologic agent will use for better asthma control  Follow-up 4 months or sooner if needed   I appreciate the opportunity to take part in Amalio's care. Please do not hesitate to contact me with questions.  Sincerely,   Margo AyeShaylar Albie Bazin, MD Allergy/Immunology [facility]

## 2017-03-24 NOTE — Patient Instructions (Addendum)
Asthma with exacerbation    - continue current asthma regimen: Advair diskus 1 puffs twice a day, spiriva 1 puff daily, prednisone 5mg  daily, albuterol inhaler as needed for cough, wheeze, difficulty breathing.     - take prednisone 20mg  twice a day for the next 5 days.      - Take Mucinex 1200mg  daily with plenty of water     - obtain CXR     - please obtain labwork for CBC w diff to assess for eosinophilia    - you will likely qualify for asthma medications like Nucala or Fasenra monthly injectable medications to help control your asthma and decrease or get you off of daily prednisone.  These medications are asthma controlling medications.    Asthma control goals:   Full participation in all desired activities (may need albuterol before activity)  Albuterol use two time or less a week on average (not counting use with activity)  Cough interfering with sleep two time or less a month  Oral steroids no more than once a year  No hospitalizations  Reflux/dysphagia     - continue Prilosec daily      - continue Carafate  Allergies     - continue flonase 1-2 sprays daily as needed for nasal congestion/drainage     - will obtain environmental allergen panel  Follow-up 4 months

## 2017-03-25 LAB — CBC WITH DIFFERENTIAL/PLATELET
BASOS PCT: 0 %
Basophils Absolute: 0 cells/uL (ref 0–200)
Eosinophils Absolute: 0 cells/uL — ABNORMAL LOW (ref 15–500)
Eosinophils Relative: 0 %
HCT: 45.3 % (ref 38.5–50.0)
Hemoglobin: 15.4 g/dL (ref 13.2–17.1)
Lymphocytes Relative: 7 %
Lymphs Abs: 1071 cells/uL (ref 850–3900)
MCH: 29.2 pg (ref 27.0–33.0)
MCHC: 34 g/dL (ref 32.0–36.0)
MCV: 85.8 fL (ref 80.0–100.0)
MONOS PCT: 1 %
MPV: 10.9 fL (ref 7.5–12.5)
Monocytes Absolute: 153 cells/uL — ABNORMAL LOW (ref 200–950)
NEUTROS ABS: 14076 {cells}/uL — AB (ref 1500–7800)
Neutrophils Relative %: 92 %
PLATELETS: 332 10*3/uL (ref 140–400)
RBC: 5.28 MIL/uL (ref 4.20–5.80)
RDW: 13.3 % (ref 11.0–15.0)
WBC: 15.3 10*3/uL — ABNORMAL HIGH (ref 3.8–10.8)

## 2017-03-28 LAB — CP584 ZONE 3
Allergen, A. alternata, m6: <0.1 kU/L
Allergen, Black Locust, Acacia9: <0.1 kU/L
Allergen, Cedar tree, t12: <0.1 kU/L
Allergen, Comm Silver Birch, t9: <0.1 kU/L
Allergen, Mucor Racemosus, M4: <0.1 kU/L
Allergen, S. Botryosum, m10: <0.1 kU/L
Box Elder IgE: <0.1 kU/L
Cat Dander: <0.1 kU/L
Cockroach: <0.1 kU/L
Common Ragweed: <0.1 kU/L
Elm IgE: <0.1 kU/L
Meadow Grass: <0.1 kU/L
Pecan/Hickory Tree IgE: <0.1 kU/L
Plantain: <0.1 kU/L
Rough Pigweed  IgE: <0.1 kU/L

## 2017-04-22 ENCOUNTER — Other Ambulatory Visit: Payer: Self-pay | Admitting: *Deleted

## 2017-04-22 ENCOUNTER — Telehealth: Payer: Self-pay | Admitting: *Deleted

## 2017-04-22 MED ORDER — BENRALIZUMAB 30 MG/ML ~~LOC~~ SOSY: 30.0000 mg | PREFILLED_SYRINGE | SUBCUTANEOUS | 8 refills | Status: DC

## 2017-04-22 NOTE — Telephone Encounter (Signed)
Called patient and discussed going on Fasenra as add on for his asthma disease.  Also advised patient that his insurance pharmacy plan will not pay for these medications they have to come from [place] [facility].  I asked him which he had been seen at and he advise the [facility] location.  I advised him I would send Rx to them and would be in contact with him in the next week or so regarding submission

## 2017-05-03 ENCOUNTER — Telehealth: Payer: Self-pay | Admitting: *Deleted

## 2017-05-03 NOTE — Telephone Encounter (Signed)
Called patient and advised that only way to get on Harrington Challenger is through [place] [facility] his [employer] will not approve or pay fos same. He is going to try to get established at [facility].  I told him to let me know when he does and I will send rx to them.  He also inquired about FMLA papers he dropped off last week for Dr Delorse Lek to fill out. I had [place] office fax paperwork that has been done to [facility] to have Dr Delorse Lek sign and called and advised patient I faxed same

## 2017-07-06 ENCOUNTER — Telehealth: Payer: Self-pay | Admitting: Allergy

## 2017-07-06 MED ORDER — ALBUTEROL SULFATE HFA 108 (90 BASE) MCG/ACT IN AERS: 2.0000 | INHALATION_SPRAY | RESPIRATORY_TRACT | 0 refills | Status: DC | PRN

## 2017-07-06 MED ORDER — ALBUTEROL SULFATE HFA 108 (90 BASE) MCG/ACT IN AERS
2.0000 | INHALATION_SPRAY | RESPIRATORY_TRACT | 0 refills | Status: DC | PRN
Start: 2017-07-06 — End: 2017-11-03

## 2017-07-06 NOTE — Addendum Note (Signed)
Addended by: Bennye AlmMIRANDA, Cristen Murcia on: 07/06/2017 11:31 AM   Modules accepted: Orders

## 2017-07-06 NOTE — Telephone Encounter (Signed)
Patient is requesting a refill on his Albuterol ProAir. [facility] [place]

## 2017-07-06 NOTE — Telephone Encounter (Signed)
Refill sent in patient aware  

## 2017-11-03 ENCOUNTER — Encounter: Payer: Self-pay | Admitting: Allergy

## 2017-11-03 ENCOUNTER — Ambulatory Visit (INDEPENDENT_AMBULATORY_CARE_PROVIDER_SITE_OTHER): Admitting: Allergy

## 2017-11-03 VITALS — BP 114/84 | HR 84 | Resp 16

## 2017-11-03 DIAGNOSIS — J4550 Severe persistent asthma, uncomplicated: Secondary | ICD-10-CM

## 2017-11-03 DIAGNOSIS — J3089 Other allergic rhinitis: Secondary | ICD-10-CM

## 2017-11-03 DIAGNOSIS — K219 Gastro-esophageal reflux disease without esophagitis: Secondary | ICD-10-CM | POA: Diagnosis not present

## 2017-11-03 MED ORDER — FLUTICASONE FUROATE-VILANTEROL 200-25 MCG/INH IN AEPB: 1.0000 | INHALATION_SPRAY | Freq: Every day | RESPIRATORY_TRACT | 0 refills | Status: DC

## 2017-11-03 MED ORDER — TIOTROPIUM BROMIDE MONOHYDRATE 2.5 MCG/ACT IN AERS: 2.0000 | INHALATION_SPRAY | Freq: Every day | RESPIRATORY_TRACT | 0 refills | Status: DC

## 2017-11-03 MED ORDER — PREDNISONE 5 MG PO TABS: 5.0000 mg | ORAL_TABLET | Freq: Every day | ORAL | 1 refills | Status: DC

## 2017-11-03 MED ORDER — ALBUTEROL SULFATE HFA 108 (90 BASE) MCG/ACT IN AERS: 2.0000 | INHALATION_SPRAY | RESPIRATORY_TRACT | 1 refills | Status: DC | PRN

## 2017-11-03 MED ORDER — MONTELUKAST SODIUM 10 MG PO TABS: 10.0000 mg | ORAL_TABLET | Freq: Every day | ORAL | 1 refills | Status: DC

## 2017-11-03 NOTE — Progress Notes (Signed)
Follow-up Note  RE: Dustin Macias MRN: [id number] DOB: 16-Aug-1971 Date of Office Visit: 11/03/2017   History of present illness: Dustin Macias is a 47 y.o. male presenting today for follow-up of ashtma, allergic rhinitis and reflux.  He was last seen in the office on 03/24/17 by myself.  He states he is doing well in regards to his asthma.  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  He reports having nighttime awakenings about 2-3 nights per week.  He is on daily prednisone 5mg  and states he has not needed to increase this dose for any reason since his last visit.  He is on advair diskus 1 puff twice a day that he has been on for years.  He also has spiriva that he states he uses on occasions when he has difficulty breathing however states is is rare that he uses the spiriva.  He is also on singulair daily.  He did not start on Fasenra and [facility].   He does continue to take prilosec.  He also has flonase that he uses as needed.    Review of systems: Review of Systems  Constitutional: Negative for chills, fever and malaise/fatigue.  HENT: Negative for congestion, ear discharge, ear pain, nosebleeds and sore throat.   Eyes: Negative for pain, discharge and redness.  Respiratory: Positive for cough, shortness of breath and wheezing.   Cardiovascular: Negative for chest pain.  Gastrointestinal: Negative for abdominal pain, constipation, diarrhea, heartburn, nausea and vomiting.  Musculoskeletal: Negative for joint pain.  Skin: Negative for itching and rash.  Neurological: Negative for headaches.    All other systems negative unless noted above in HPI  Past medical/social/surgical/family history have been reviewed and are unchanged unless specifically indicated below.  No changes  Medication List: Allergies as of 11/03/2017     No Known Allergies     Medication List        Accurate as of 11/03/17  2:26 PM. Always use your most recent med list.          acetaminophen 500 MG tablet Commonly known as:  TYLENOL Take 1,000-1,500 mg by mouth daily as needed for moderate pain or headache.   albuterol 108 (90 Base) MCG/ACT inhaler Commonly known as:  PROAIR HFA Inhale 2 puffs into the lungs every 4 (four) hours as needed for wheezing or shortness of breath.   Benralizumab 30 MG/ML Sosy Commonly known as:  FASENRA Inject 30 mg into the skin every 28 (twenty-eight) days. For 3 doses, then 30 mg every 8 weeks   calcium carbonate 500 MG chewable tablet Commonly known as:  TUMS - dosed in mg elemental calcium Chew 2-3 tablets by mouth daily as needed for indigestion or heartburn.   fluticasone 50 MCG/ACT nasal spray Commonly known as:  FLONASE Place 1-2 sprays into both nostrils daily.   fluticasone furoate-vilanterol 200-25 MCG/INH Aepb Commonly known as:  BREO ELLIPTA Inhale 1 puff into the lungs daily.   ibuprofen 200 MG tablet Commonly known as:  ADVIL,MOTRIN Take 600 mg by mouth every 6 (six) hours as needed for headache or moderate pain.   montelukast 10 MG tablet Commonly known as:  SINGULAIR Take 1 tablet (10 mg total) by mouth at bedtime.   omeprazole 20 MG capsule Commonly known as:  PRILOSEC Take 20 mg  by mouth daily.   predniSONE 5 MG tablet Commonly known as:  DELTASONE Take 1 tablet (5 mg total) by mouth daily with breakfast.   sucralfate 1 g tablet Commonly known as:  CARAFATE Take 1 tablet (1 g total) by mouth 4 (four) times daily - after meals and at bedtime.   Tiotropium Bromide Monohydrate 2.5 MCG/ACT Aers Commonly known as:  SPIRIVA RESPIMAT Inhale 2 puffs into the lungs daily.       Known medication allergies: No Known Allergies   Physical examination: Blood pressure 114/84, pulse 84, resp. rate 16.  General: Alert, interactive, in no acute distress. HEENT:  PERRLA, TMs pearly gray, turbinates minimally edematous without discharge, post-pharynx non erythematous. Neck: Supple without lymphadenopathy. Lungs: Clear to auscultation without wheezing, rhonchi or rales. {no increased work of breathing. CV: Normal S1, S2 without murmurs. Abdomen: Nondistended, nontender. Skin: Warm and dry, without lesions or rashes. Extremities:  No clubbing, cyanosis or edema. Neuro:   Grossly intact.  Diagnositics/Labs:  Spirometry: FEV1: 2.93L 62%, FVC: 4.42L  79%, ratio consistent with restrictive with obstructive pattern  Assessment and plan:   Asthma, severe persistent   -  Stop Advair.  Start Breo 1 puff daily.   -  take spiriva 2 puff daily   - continue your prednisone 5mg  daily   - have access to albuterol inhaler 2 puffs every 4-6 hours as needed for cough/wheeze/shortness of breath/chest tightness.  May use 15-20 minutes prior to activity.   Monitor frequency of use.      - please establish care with [facility] so that you can receive Fasenra injection through the [place].  Call us once you have [facility] with [facility].   Asthma control goals:   Full participation in all desired activities (may need albuterol before activity)  Albuterol use two time or less a week on average (not counting use with activity)  Cough interfering with sleep two time or less a month  Oral steroids no more than once a year  No hospitalizations  Reflux/dysphagia     - continue Prilosec daily      - continue Carafate  Allergies, non-allergic     - continue flonase 1-2 sprays daily as needed for nasal congestion/drainage  Follow-up 4 months I appreciate the opportunity to take part in Eldrige's care. Please do not hesitate to contact me with questions.  Sincerely,   Margo Aye, MD Allergy/Immunology [facility]

## 2017-11-03 NOTE — Patient Instructions (Addendum)
Asthma, severe persistent   -  Stop Advair.  Start Breo 200mcg 1 puff daily.   -  take spiriva 2 puff daily   - continue your prednisone 5mg  daily   - have access to albuterol inhaler 2 puffs every 4-6 hours as needed for cough/wheeze/shortness of breath/chest tightness.  May use 15-20 minutes prior to activity.   Monitor frequency of use.      - [facility] [place] [facility] [place]  [facility] us once you have [facility] care with [facility].   Asthma control goals:   Full participation in all desired activities (may need albuterol before activity)  Albuterol use two time or less a week on average (not counting use with activity)  Cough interfering with sleep two time or less a month  Oral steroids no more than once a year  No hospitalizations  Reflux/dysphagia     - continue Prilosec daily      - continue Carafate  Allergies, non-allergic     - continue flonase 1-2 sprays daily as needed for nasal congestion/drainage  Follow-up 4 months

## 2017-11-14 ENCOUNTER — Telehealth: Payer: Self-pay | Admitting: Allergy

## 2017-11-14 NOTE — Telephone Encounter (Signed)
Patient states that the very last page was not signed and sent in. I checked the media file and that page is missing. He will bring it in 11-15-17 to have the last page signed. I will copy that one and have it scanned in.

## 2017-11-14 NOTE — Telephone Encounter (Signed)
Pt called and said that the back page on the fmla papers was not sign or dated [phone]

## 2017-11-16 NOTE — Telephone Encounter (Signed)
Faxed the back sheet this morning after padgett signed

## 2017-11-21 ENCOUNTER — Telehealth: Payer: Self-pay | Admitting: Allergy

## 2017-11-21 NOTE — Telephone Encounter (Signed)
Patient called regarding his FMLA paperwork. He said page 4, question #3 needs to be changed. The question is patient unable to perform job due to condition and it was marked NO. He said in order for them to accept this, it must be marked YES. He said Monia Pouchetna said No could be marked through and initialed, then Yes written. Then fax back to [phone].

## 2017-11-21 NOTE — Telephone Encounter (Signed)
Completed this and will have page faxed back.

## 2017-11-21 NOTE — Telephone Encounter (Signed)
Dustin advise if you will change answer.

## 2017-11-21 NOTE — Telephone Encounter (Signed)
Can you print off the forms so I can see what it says.     His condition only affects his ability to perform his job if he is having an asthma flare.   Thus the answer to the question can be Yes with *only during asthma exacerbation; he should be able to perform his duties when not exacerbated.

## 2017-11-21 NOTE — Telephone Encounter (Signed)
Printed and placed on your desk. 

## 2017-12-27 ENCOUNTER — Other Ambulatory Visit: Payer: Self-pay

## 2017-12-27 DIAGNOSIS — J4550 Severe persistent asthma, uncomplicated: Secondary | ICD-10-CM

## 2017-12-27 MED ORDER — ALBUTEROL SULFATE HFA 108 (90 BASE) MCG/ACT IN AERS: 2.0000 | INHALATION_SPRAY | RESPIRATORY_TRACT | 1 refills | Status: DC | PRN

## 2018-02-17 ENCOUNTER — Other Ambulatory Visit: Payer: Self-pay | Admitting: *Deleted

## 2018-02-17 DIAGNOSIS — J4550 Severe persistent asthma, uncomplicated: Secondary | ICD-10-CM

## 2018-02-17 MED ORDER — ALBUTEROL SULFATE HFA 108 (90 BASE) MCG/ACT IN AERS: 2.0000 | INHALATION_SPRAY | RESPIRATORY_TRACT | 1 refills | Status: DC | PRN

## 2018-03-29 ENCOUNTER — Telehealth: Payer: Self-pay | Admitting: Allergy

## 2018-03-29 DIAGNOSIS — J4550 Severe persistent asthma, uncomplicated: Secondary | ICD-10-CM

## 2018-03-29 MED ORDER — ALBUTEROL SULFATE HFA 108 (90 BASE) MCG/ACT IN AERS
2.0000 | INHALATION_SPRAY | RESPIRATORY_TRACT | 0 refills | Status: DC | PRN
Start: 2018-03-29 — End: 2018-05-10

## 2018-03-29 NOTE — Telephone Encounter (Signed)
Called and informed patient that I have sent in proair to the [facility] however in order to receive further refills patient is due for an OV. Patient made an OV on 04/21/2018 at 11 am with Dr. Delorse LekPadgett.

## 2018-03-29 NOTE — Telephone Encounter (Signed)
Pt called and said that he need a refill on proiar  Called into [facility] on [address] [phone].

## 2018-04-17 ENCOUNTER — Other Ambulatory Visit: Payer: Self-pay | Admitting: Allergy

## 2018-04-17 DIAGNOSIS — J4550 Severe persistent asthma, uncomplicated: Secondary | ICD-10-CM

## 2018-04-18 ENCOUNTER — Telehealth: Payer: Self-pay | Admitting: Allergy

## 2018-04-18 NOTE — Telephone Encounter (Signed)
Patient needs refill on albuterol sent in to [facility] [address] - has an appt this friday

## 2018-04-18 NOTE — Telephone Encounter (Signed)
Spoke to patient advised a refill was sent in to the pharmacy yesterday. Patient verbalized understanding and will contact pharmacy

## 2018-04-21 ENCOUNTER — Ambulatory Visit (INDEPENDENT_AMBULATORY_CARE_PROVIDER_SITE_OTHER): Admitting: Allergy

## 2018-04-21 ENCOUNTER — Encounter: Payer: Self-pay | Admitting: Allergy

## 2018-04-21 VITALS — BP 112/86 | HR 86 | Resp 16 | Ht 76.0 in | Wt 235.0 lb

## 2018-04-21 DIAGNOSIS — K219 Gastro-esophageal reflux disease without esophagitis: Secondary | ICD-10-CM | POA: Diagnosis not present

## 2018-04-21 DIAGNOSIS — J4550 Severe persistent asthma, uncomplicated: Secondary | ICD-10-CM | POA: Diagnosis not present

## 2018-04-21 DIAGNOSIS — J310 Chronic rhinitis: Secondary | ICD-10-CM

## 2018-04-21 MED ORDER — FLUTICASONE PROPIONATE HFA 110 MCG/ACT IN AERO: 2.0000 | INHALATION_SPRAY | Freq: Two times a day (BID) | RESPIRATORY_TRACT | 5 refills | Status: DC

## 2018-04-21 MED ORDER — EPINEPHRINE 0.3 MG/0.3ML IJ SOAJ: 0.3000 mg | Freq: Once | INTRAMUSCULAR | 2 refills | Status: AC

## 2018-04-21 MED ORDER — MONTELUKAST SODIUM 10 MG PO TABS: 10.0000 mg | ORAL_TABLET | Freq: Every day | ORAL | 1 refills | Status: DC

## 2018-04-21 MED ORDER — OMEPRAZOLE 20 MG PO CPDR: 20.0000 mg | DELAYED_RELEASE_CAPSULE | Freq: Every day | ORAL | 1 refills | Status: DC

## 2018-04-21 MED ORDER — PREDNISONE 5 MG PO TABS: 5.0000 mg | ORAL_TABLET | Freq: Every day | ORAL | 1 refills | Status: DC

## 2018-04-21 NOTE — Progress Notes (Addendum)
Follow-up Note  RE: Dustin Cotealmadge Sokolov MRN: [id number] DOB: 03-May-1971 Date of Office Visit: 04/21/2018   History of present illness: Dustin Macias is a 47 y.o. male presenting today for follow-up of severe persistent asthma, reflux and non-allergic rhinitis. He has last seen in the office on 11/03/17 by myself.  His asthma remains not well-controlled.  He is currently on Breo daily, Spiriva daily, and prednisone 5 mg daily as chronic medication. He states that he uses his albuterol rescue inhaler 4-5x per day.  He states he normally has to use it at work as he works in a factory and while he reports spending 75% of the time in an office the other 25% percent he is on the floor where he can be exposed to a lot of dust and particles in the air.  He has been asked on previous visits to [facility] in [facility] for him to receive Fasenra injections for better asthma control.  He has not done this yet he states due to due to his work schedule (he works nights).  He continues to take prilosec for reflux and uses flonase as needed for nasal congestion.  Review of systems: Review of Systems  Constitutional: Negative for chills, fever and malaise/fatigue.  HENT: Negative for congestion, ear discharge, ear pain, nosebleeds and sore throat.   Eyes: Negative for pain, discharge and redness.  Respiratory: Positive for cough, shortness of breath and wheezing. Negative for hemoptysis and sputum production.   Cardiovascular: Negative for chest pain.  Gastrointestinal: Negative for abdominal pain, constipation, diarrhea, nausea and vomiting.  Musculoskeletal: Negative for joint pain.  Skin: Negative for itching and rash.  Neurological: Negative for headaches.    All other systems negative unless noted above in HPI  Past medical/social/surgical/family history have been reviewed and are unchanged unless specifically indicated below.  No changes  Medication List: Allergies as of 04/21/2018     No Known Allergies     Medication List        Accurate as of 04/21/18  1:29 PM. Always use your most recent med list.          acetaminophen 500 MG tablet Commonly known as:  TYLENOL Take 1,000-1,500 mg by mouth daily as needed for moderate pain or headache.   albuterol 108 (90 Base) MCG/ACT inhaler Commonly known as:  PROVENTIL HFA;VENTOLIN HFA Inhale 2 puffs into the lungs every 4 (four) hours as needed for wheezing or shortness of breath.   calcium carbonate 500 MG chewable tablet Commonly known as:  TUMS - dosed in mg elemental calcium Chew 2-3 tablets by mouth daily as needed for indigestion or heartburn.   fluticasone 50 MCG/ACT nasal spray Commonly known as:  FLONASE Place 1-2 sprays into both nostrils daily.   fluticasone furoate-vilanterol 200-25 MCG/INH Aepb Commonly known as:  BREO ELLIPTA Inhale 1 puff into the lungs daily.   ibuprofen 200 MG tablet Commonly known as:  ADVIL,MOTRIN Take 600 mg by mouth every 6 (six) hours as needed for headache or moderate pain.   montelukast 10 MG tablet Commonly known as:  SINGULAIR Take 1 tablet (10 mg total) by mouth at bedtime.   omeprazole 20 MG capsule Commonly known as:  PRILOSEC Take 20 mg by mouth daily.   predniSONE 5 MG tablet Commonly known as:  DELTASONE Take 1 tablet (5 mg total) by mouth daily with breakfast.   sucralfate 1 g tablet Commonly known as:  CARAFATE Take 1 tablet (1 g total) by  mouth 4 (four) times daily - after meals and at bedtime.   Tiotropium Bromide Monohydrate 2.5 MCG/ACT Aers Inhale 2 puffs into the lungs daily.       Known medication allergies: No Known Allergies   Physical examination: Blood pressure 112/86, pulse 86, resp. rate 16, height 6\' 4"  (1.93 m), weight 235 lb (106.6 kg), SpO2 97 %.  General: Alert, interactive, in no acute distress. HEENT: TMs pearly gray, turbinates minimally edematous without discharge, post-pharynx mildly erythematous. Neck: Supple without  lymphadenopathy. Lungs: Mildly decreased breath sounds with expiratory wheezing bilaterally. {no increased work of breathing. CV: Normal S1, S2 without murmurs. Abdomen: Nondistended, nontender. Skin: Warm and dry, without lesions or rashes. Extremities:  No clubbing, cyanosis or edema. Neuro:   Grossly intact.  Diagnositics/Labs: Labs: None  Spirometry: FEV1: 3.05L 63%, FVC: 4.56L 74%, ratio 85% consistent with mixed restrictive and obstructive pattern  Allergy testing: None  Assessment and plan:   Asthma, severe persistent   -  his asthma remains poorly controlled.  I again have discussed with him the importance of improving his asthma control to improve his day-to-day health, decrease missed work days, prevent need for increased systemic steroid need, ED/UC visits or hospitalizations.  He understands need to improved control however states his schedule has prevented him from [facility].  We came to agreement today that he will do his part to [facility] in the next 6 months and he will let us know when this has been done so we can set up shipment and initiation of anti-IL5.     -  Continue Breo 200mcg 1 puff daily.   -  Continue Spiriva 2 puff daily   -  Add in Flovent 110mcg 2 puffs twice daily to your regimen for additional inhaled steroid control. This is to help prevent need to increase systemic steroid   - continue your prednisone 5mg  daily (may need to increase to 7.5mg  or 10mg  if addition of Qvar   - have access to albuterol inhaler 2 puffs every 4-6 hours as needed for cough/wheeze/shortness of breath/chest tightness.  May use 15-20 minutes prior to activity.   Monitor frequency of use.      Asthma control goals:   Full participation in all desired activities (may need albuterol before activity)  Albuterol use two time or less a week on average (not counting use with activity)  Cough interfering with sleep two time or less  a month  Oral steroids no more than once a year  No hospitalizations  Reflux/dysphagia     - continue Prilosec daily      - continue Carafate  Allergies, non-allergic     - continue flonase 1-2 sprays daily as needed for nasal congestion/drainage  Follow-up 4 months  Jaynie Breameborah Masiel Gentzler, MD PGY-1  I performed a history and physical examination of the patient and discussed management with the resident. I reviewed the resident's note and agree with the documented findings and plan of care. The note in its entirety was edited by myself, including the physical exam, assessment, and plan.    I appreciate the opportunity to take part in Erie's care. Please do not hesitate to contact me with questions.  Sincerely,   Margo AyeShaylar Padgett, MD Allergy/Immunology [facility] [place]

## 2018-04-21 NOTE — Patient Instructions (Signed)
Asthma, severe persistent   -  Continue Breo 200mcg 1 puff daily.   -  Continue Spiriva 2 puff daily   -  Add in Flovent 110mcg 2 puffs twice daily to your regimen for additional inhaled steroid control   - continue your prednisone 5mg  daily (may need to increase to 7.5mg  or 10mg  if addition of Qvar   - have access to albuterol inhaler 2 puffs every 4-6 hours as needed for cough/wheeze/shortness of breath/chest tightness.  May use 15-20 minutes prior to activity.   Monitor frequency of use.      - [facility] [place] [facility] [place].  Call us once you have [facility] care with [facility].   Asthma control goals:   Full participation in all desired activities (may need albuterol before activity)  Albuterol use two time or less a week on average (not counting use with activity)  Cough interfering with sleep two time or less a month  Oral steroids no more than once a year  No hospitalizations  Reflux/dysphagia     - continue Prilosec daily      - continue Carafate  Allergies, non-allergic     - continue flonase 1-2 sprays daily as needed for nasal congestion/drainage  Follow-up 4 months

## 2018-05-10 ENCOUNTER — Other Ambulatory Visit: Payer: Self-pay | Admitting: Allergy

## 2018-05-10 DIAGNOSIS — J4550 Severe persistent asthma, uncomplicated: Secondary | ICD-10-CM

## 2018-06-19 ENCOUNTER — Other Ambulatory Visit: Payer: Self-pay | Admitting: Allergy

## 2018-06-19 DIAGNOSIS — J4550 Severe persistent asthma, uncomplicated: Secondary | ICD-10-CM

## 2018-06-19 NOTE — Telephone Encounter (Signed)
RF on ProAir x 1 with 1 refill at [facility]

## 2018-07-03 ENCOUNTER — Telehealth: Payer: Self-pay | Admitting: Allergy

## 2018-07-03 DIAGNOSIS — J4550 Severe persistent asthma, uncomplicated: Secondary | ICD-10-CM

## 2018-07-03 MED ORDER — ALBUTEROL SULFATE HFA 108 (90 BASE) MCG/ACT IN AERS
INHALATION_SPRAY | RESPIRATORY_TRACT | 0 refills | Status: DC
Start: 2018-07-03 — End: 2018-08-19

## 2018-07-03 NOTE — Telephone Encounter (Signed)
Patient needs all scripts sent to the [facility] in [facility] And patient needs a refill on albuterol sent to the [facility] [place]  Please call if there are any questions

## 2018-07-03 NOTE — Telephone Encounter (Signed)
Called and left message to return call need to know what medications he needs called in due to last ov we sent [facility]. Proair HFA was sent in

## 2018-07-06 NOTE — Telephone Encounter (Signed)
Called and left message for patient to call office in regards to medication refills.

## 2018-07-11 NOTE — Telephone Encounter (Signed)
Phone call got disconnected

## 2018-07-11 NOTE — Telephone Encounter (Signed)
Spoke to patient states all he needed sent in was albuterol. That was ordered last week patient does not need anything at this time

## 2018-08-04 ENCOUNTER — Other Ambulatory Visit: Payer: Self-pay | Admitting: Allergy

## 2018-08-04 DIAGNOSIS — J4550 Severe persistent asthma, uncomplicated: Secondary | ICD-10-CM

## 2018-08-19 MED ORDER — ALBUTEROL SULFATE HFA 108 (90 BASE) MCG/ACT IN AERS: INHALATION_SPRAY | RESPIRATORY_TRACT | 1 refills | Status: DC

## 2018-08-19 NOTE — Telephone Encounter (Signed)
I received a call from Mr. Dustin Macias letting me know that his albuterol prescription had not reached the pharmacy. Therefore I sent it in again.  Dustin BondsJoel Gallagher, MD [facility] [place]

## 2018-08-19 NOTE — Addendum Note (Signed)
Addended by: Alfonse SpruceGALLAGHER, Glendon Dunwoody [place] on: 08/19/2018 11:00 AM   Modules accepted: Orders

## 2018-08-23 ENCOUNTER — Ambulatory Visit: Admitting: Allergy

## 2018-08-23 DIAGNOSIS — J309 Allergic rhinitis, unspecified: Secondary | ICD-10-CM

## 2018-09-26 ENCOUNTER — Other Ambulatory Visit: Payer: Self-pay | Admitting: *Deleted

## 2018-09-26 ENCOUNTER — Telehealth: Payer: Self-pay

## 2018-09-26 DIAGNOSIS — J4550 Severe persistent asthma, uncomplicated: Secondary | ICD-10-CM

## 2018-09-26 MED ORDER — ALBUTEROL SULFATE HFA 108 (90 BASE) MCG/ACT IN AERS: INHALATION_SPRAY | RESPIRATORY_TRACT | 0 refills | Status: DC

## 2018-09-26 NOTE — Telephone Encounter (Signed)
Patient is calling requesting a refill on his albuterol inhaler. I informed him that he no showed his 4 month follow up visit in December and that he will need an appt. So the patient made a follow up with anne on this coming Friday 24th. Patient is still insisting that he needs a refill. I asked him how many puffs he had, he stated he had 20 something. I informed the patient that should be enough till Friday. He stated he does 6-7 puffs a day sometimes.  Please Advise.  Thanks   [employer].

## 2018-09-26 NOTE — Telephone Encounter (Signed)
Courtesy refill has been sent in. Called the patient and informed to keep appointment in order to receive more refills. Patient verbalized understanding.

## 2018-09-29 ENCOUNTER — Encounter: Payer: Self-pay | Admitting: Family Medicine

## 2018-09-29 ENCOUNTER — Ambulatory Visit (INDEPENDENT_AMBULATORY_CARE_PROVIDER_SITE_OTHER): Admitting: Family Medicine

## 2018-09-29 VITALS — BP 128/90 | HR 89 | Resp 16 | Ht 75.0 in | Wt 251.2 lb

## 2018-09-29 DIAGNOSIS — J4550 Severe persistent asthma, uncomplicated: Secondary | ICD-10-CM | POA: Insufficient documentation

## 2018-09-29 DIAGNOSIS — J310 Chronic rhinitis: Secondary | ICD-10-CM | POA: Diagnosis not present

## 2018-09-29 DIAGNOSIS — K219 Gastro-esophageal reflux disease without esophagitis: Secondary | ICD-10-CM | POA: Diagnosis not present

## 2018-09-29 MED ORDER — PREDNISONE 5 MG PO TABS: 5.0000 mg | ORAL_TABLET | Freq: Every day | ORAL | 1 refills | Status: DC

## 2018-09-29 MED ORDER — FLUTICASONE PROPIONATE HFA 110 MCG/ACT IN AERO: INHALATION_SPRAY | RESPIRATORY_TRACT | 5 refills | Status: DC

## 2018-09-29 MED ORDER — ALBUTEROL SULFATE HFA 108 (90 BASE) MCG/ACT IN AERS: INHALATION_SPRAY | RESPIRATORY_TRACT | 1 refills | Status: DC

## 2018-09-29 MED ORDER — MONTELUKAST SODIUM 10 MG PO TABS: 10.0000 mg | ORAL_TABLET | Freq: Every day | ORAL | 1 refills | Status: DC

## 2018-09-29 NOTE — Patient Instructions (Addendum)
Asthma, severe persistent   -  Continue Breo 200mcg 1 puff daily.   -  Continue Spiriva 1.25 mcg 2 puff daily   -  Add in Flovent 110 mcg 2 puffs twice daily to your regimen for additional inhaled steroid control   - continue your prednisone 5mg  daily  - have access to albuterol inhaler 2 puffs every 4-6 hours as needed for cough/wheeze/shortness of breath/chest tightness.  May use 15-20 minutes prior to activity.   Monitor frequency of use.      - [facility] [place] [facility] [place].  Call us once you have [facility] care with [facility].   Asthma control goals:   Full participation in all desired activities (may need albuterol before activity)  Albuterol use two time or less a week on average (not counting use with activity)  Cough interfering with sleep two time or less a month  Oral steroids no more than once a year  No hospitalizations  Reflux/dysphagia     - continue Prilosec daily      - continue lifestyle modifications  Allergies, non-allergic     - continue flonase 1-2 sprays daily as needed for nasal congestion/drainage  Call the clinic if this treatment plan is not working well for you  Follow-up 4 months or sooner if needed

## 2018-09-29 NOTE — Progress Notes (Signed)
[address] [place] [id number] Dept: [phone]  FOLLOW UP NOTE  Patient ID: Dustin Macias, male    DOB: Jan 28, 1971  Age: 48 y.o. MRN: [id number] Date of Office Visit: 09/29/2018  Assessment  Chief Complaint: Asthma  HPI Dustin Macias is a 48 year old male who presents to the clinic for a follow up visit. He was last seen in this clinic on 04/21/2018 by Dr. Delorse LekPadgett for evaluation of asthma, chronic rhinitis, and reflux. He reports his asthma remains poorly controlled with some shortness of breath and wheeze which is worse with activity. He denies cough. He is currently using Breo 200-1 puff once a day, montelukast 10 mg once a day, prednisone 5 mg once a day, and albuterol at least once a day. He is out of Flovent 110 and Spiriva Respimat. He has not been to the [facility] at this time.  He is expressing interest in a biologic therapy in order to better control his asthma.  Allergic rhinitis is reported as well controlled with occasional use of Flonase as needed.  Reflux is reported as well controlled with omeprazole 20 mg once a day.  His current medications are listed in the chart.   Drug Allergies:  No Known Allergies  Physical Exam: BP 128/90 (BP Location: Left Arm, Patient Position: Sitting, Cuff Size: Large)   Pulse 89   Resp 16   Ht 6\' 3"  (1.905 m)   Wt 251 lb 3.2 oz (113.9 kg)   SpO2 95%   BMI 31.40 kg/m    Physical Exam Vitals signs reviewed.  Constitutional:      Appearance: Normal appearance.  HENT:     Head: Normocephalic and atraumatic.     Right Ear: Tympanic membrane normal.     Left Ear: Tympanic membrane normal.     Nose:     Comments: Bilateral nares slightly erythematous with no nasal drainage noted.  Pharynx slightly erythematous with no exudate noted.  Ears normal.  Eyes normal. Eyes:     Conjunctiva/sclera: Conjunctivae normal.  Neck:     Musculoskeletal: Normal range of motion and neck supple.  Cardiovascular:     Rate and Rhythm:  Normal rate and regular rhythm.     Heart sounds: Normal heart sounds. No murmur.  Pulmonary:     Effort: Pulmonary effort is normal.     Breath sounds: Normal breath sounds.     Comments: Lungs clear to auscultation Musculoskeletal: Normal range of motion.  Skin:    General: Skin is warm and dry.  Neurological:     Mental Status: He is alert and oriented to person, place, and time.  Psychiatric:        Mood and Affect: Mood normal.        Behavior: Behavior normal.        Thought Content: Thought content normal.        Judgment: Judgment normal.     Diagnostics: FVC 4.77, FEV1 2.96.  Predicted FVC 6.04, predicted FEV1 4.70.  Spirometry indicates mild restriction and mild airway obstruction.  Spirometry is consistent with previous readings.  Assessment and Plan: 1. Severe persistent asthma, uncomplicated   2. Non-allergic rhinitis   3. Gastroesophageal reflux disease, esophagitis presence not specified     Meds ordered this encounter  Medications  . montelukast (SINGULAIR) 10 MG tablet    Sig: Take 1 tablet (10 mg total) by mouth at bedtime.    Dispense:  90 tablet    Refill:  1  .  fluticasone (FLOVENT HFA) 110 MCG/ACT inhaler    Sig: Inhale 2 puffs by mouth two times daily    Dispense:  1 Inhaler    Refill:  5  . predniSONE (DELTASONE) 5 MG tablet    Sig: Take 1 tablet (5 mg total) by mouth daily with breakfast.    Dispense:  90 tablet    Refill:  1  . albuterol (PROAIR HFA) 108 (90 Base) MCG/ACT inhaler    Sig: INHALE 2 PUFFS INTO THE LUNGS EVERY 4 HOURS AS NEEDED FOR WHEEZING OR SHORTNESS OF BREATH    Dispense:  1 Inhaler    Refill:  1    Patient Instructions  Asthma, severe persistent   -  Continue Breo 1 puff daily.   -  Continue Spiriva 1.25 mcg 2 puff daily   -  Add in Flovent 110 mcg 2 puffs twice daily to your regimen for additional inhaled steroid control   - continue your prednisone 5mg  daily  - have access to albuterol inhaler 2 puffs every 4-6  hours as needed for cough/wheeze/shortness of breath/chest tightness.  May use 15-20 minutes prior to activity.   Monitor frequency of use.      - please establish care with [facility] so that you can receive Fasenra injections through the [place]  [facility]   Asthma control goals:   Full participation in all desired activities (may need albuterol before activity)  Albuterol use two time or less a week on average (not counting use with activity)  Cough interfering with sleep two time or less a month  Oral steroids no more than once a year  No hospitalizations  Reflux/dysphagia     - continue Prilosec daily      - continue lifestyle modifications  Allergies, non-allergic     - continue flonase 1-2 sprays daily as needed for nasal congestion/drainage  Call the clinic if this treatment plan is not working well for you  Follow-up 4 months or sooner if needed   Return in about 4 months (around 01/28/2019), or if symptoms worsen or fail to improve.    Thank you for the opportunity to care for this patient.  Please do not hesitate to contact me with questions.  Thermon Leyland, FNP [facility]

## 2018-11-09 ENCOUNTER — Other Ambulatory Visit: Payer: Self-pay | Admitting: Family Medicine

## 2018-12-19 ENCOUNTER — Other Ambulatory Visit: Payer: Self-pay | Admitting: Family Medicine

## 2019-01-26 ENCOUNTER — Other Ambulatory Visit: Payer: Self-pay | Admitting: Family Medicine

## 2019-01-26 ENCOUNTER — Ambulatory Visit: Admitting: Allergy

## 2019-04-05 ENCOUNTER — Other Ambulatory Visit: Payer: Self-pay | Admitting: Family Medicine

## 2019-04-05 NOTE — Telephone Encounter (Signed)
Courtesy refll  

## 2019-04-13 ENCOUNTER — Other Ambulatory Visit: Payer: Self-pay | Admitting: *Deleted

## 2019-04-13 ENCOUNTER — Other Ambulatory Visit: Payer: Self-pay | Admitting: Allergy

## 2019-04-13 DIAGNOSIS — J4550 Severe persistent asthma, uncomplicated: Secondary | ICD-10-CM

## 2019-04-13 MED ORDER — ALBUTEROL SULFATE HFA 108 (90 BASE) MCG/ACT IN AERS: INHALATION_SPRAY | RESPIRATORY_TRACT | 0 refills | Status: DC

## 2019-06-14 ENCOUNTER — Other Ambulatory Visit: Payer: Self-pay | Admitting: Allergy

## 2019-06-20 ENCOUNTER — Telehealth: Payer: Self-pay | Admitting: Family Medicine

## 2019-06-20 MED ORDER — ALBUTEROL SULFATE HFA 108 (90 BASE) MCG/ACT IN AERS: INHALATION_SPRAY | RESPIRATORY_TRACT | 0 refills | Status: DC

## 2019-06-20 NOTE — Telephone Encounter (Signed)
Pt call and said that he order his proair from [facility] and he has not got it yet and needs [place] to send one into his [facility] in [place] [phone].

## 2019-06-20 NOTE — Telephone Encounter (Signed)
Refilled x 1. Scheduled telemed for tomorrow.

## 2019-06-21 ENCOUNTER — Ambulatory Visit (INDEPENDENT_AMBULATORY_CARE_PROVIDER_SITE_OTHER): Admitting: Allergy

## 2019-06-21 ENCOUNTER — Encounter: Payer: Self-pay | Admitting: Allergy

## 2019-06-21 DIAGNOSIS — K219 Gastro-esophageal reflux disease without esophagitis: Secondary | ICD-10-CM

## 2019-06-21 DIAGNOSIS — J4550 Severe persistent asthma, uncomplicated: Secondary | ICD-10-CM | POA: Diagnosis not present

## 2019-06-21 DIAGNOSIS — J310 Chronic rhinitis: Secondary | ICD-10-CM | POA: Diagnosis not present

## 2019-06-21 MED ORDER — TRELEGY ELLIPTA 100-62.5-25 MCG/INH IN AEPB: 1.0000 | INHALATION_SPRAY | Freq: Every day | RESPIRATORY_TRACT | 1 refills | Status: DC

## 2019-06-21 MED ORDER — ALBUTEROL SULFATE HFA 108 (90 BASE) MCG/ACT IN AERS: INHALATION_SPRAY | RESPIRATORY_TRACT | 0 refills | Status: DC

## 2019-06-21 NOTE — Progress Notes (Signed)
RE: Dustin Macias MRN: [id number] DOB: 01-Oct-1970 Date of Telemedicine Visit: 06/21/2019  Referring provider: Doree Fudge, MD Primary care provider: Doree Fudge, MD  Chief Complaint: refills   Telemedicine Follow Up Visit via Telephone: I connected with Dustin Macias for a follow up on 06/21/19 by telephone and verified that I am speaking with the correct person using two identifiers.   I discussed the limitations, risks, security and privacy concerns of performing an evaluation and management service by telephone and the availability of in person appointments. I also discussed with the patient that there may be a patient responsible charge related to this service. The patient expressed understanding and agreed to proceed.  Patient is at home.  Provider is at the office.  Visit start time: 1109 Visit end time: Dustin Macias consent/check in by: Dustin Macias Medical consent and medical assistant/nurse: Dustin Macias  History of Present Illness: He is a 48 y.o. male, who is being followed for severe persistent asthma, reflux and nonallergic rhinitis.. His previous allergy office visit was on 09/29/2018 with Dustin Macias, [place].   He states he has been doing "outstanding "since his last visit.  He denies any major health changes, surgeries or hospitalizations.  He states his asthma has been have the same.  He states he is using his albuterol about 6 or so times during the day.  He mostly uses it at work as he knows that his workplace is a trigger for him as he works in a [employer] [place] and it is consistently dusty.  He is currently doing Breo 1 puff daily.  He states he is not doing Spiriva consistently.  He is continues on his prednisone 5 mg daily.  He states he has not needed to increase this dose at all.  He has not establish care with [facility] so that he can do [place]. He states that he does take Prilosec and sometimes Tums to help with reflux control. He states he has not had any significant  nasal symptoms and has not used any no sprays. He is requesting refills today.  Assessment and Plan: Dustin Macias is a 48 y.o. male with:   Asthma, severe persistent   - Control remains poor as he had not been compliant with the use of both Breo and Spiriva he also has not established care at his [place] in order to initiate Fasenra injections.  I am not sure why he does not want to establish care at the [facility] just to receive his injections and he has not offer an explanation as to why he has refused thus far to initiate this. We will have him stop Breo and Spiriva use and replace with Trelegy 1 puff daily.  Trelegy is a triple therapy inhaler that has been recently indicated for asthma control.  This will help to simplify his routine.   - continue your prednisone 5mg  daily   - I have discussed on many visits about the use of Fasenra for additional asthma control however he would need to establish care with the [facility] in order to receive the injections through his insurance.  - have access to albuterol inhaler 2 puffs every 4-6 hours as needed for cough/wheeze/shortness of breath/chest tightness.  May use 15-20 minutes prior to activity.   Monitor frequency of use.    Asthma control goals:   Full participation in all desired activities (may need albuterol before activity)  Albuterol use two time or less a week on average (not counting use with activity)  Cough interfering with sleep two time or less a month  Oral steroids no more than once a year  No hospitalizations  Reflux/dysphagia     - continue Prilosec daily      - continue lifestyle modifications  Allergies, non-allergic     - continue flonase 1-2 sprays daily as needed for nasal congestion/drainage   Follow-up 4 months or sooner if needed  Diagnostics: None.  Medication List:  Current Outpatient Medications  Medication Sig Dispense Refill  . acetaminophen (TYLENOL) 500 MG tablet Take 1,000-1,500 mg by mouth daily as  needed for moderate pain or headache.    . albuterol (PROAIR HFA) 108 (90 Base) MCG/ACT inhaler Inhale two puffs every 4-6 hours if needed for cough or wheeze 54 g 0  . BREO ELLIPTA 200-25 MCG/INH AEPB USE 1 INHALATION DAILY 180 each 0  . calcium carbonate (TUMS - DOSED IN MG ELEMENTAL CALCIUM) 500 MG chewable tablet Chew 2-3 tablets by mouth daily as needed for indigestion or heartburn.    . fluticasone (FLONASE) 50 MCG/ACT nasal spray Place 1-2 sprays into both nostrils daily.    . fluticasone (FLOVENT HFA) 110 MCG/ACT inhaler Inhale 2 puffs by mouth two times daily 1 Inhaler 5  . ibuprofen (ADVIL,MOTRIN) 200 MG tablet Take 600 mg by mouth every 6 (six) hours as needed for headache or moderate pain.    . montelukast (SINGULAIR) 10 MG tablet Take 1 tablet (10 mg total) by mouth at bedtime. 90 tablet 1  . omeprazole (PRILOSEC) 20 MG capsule Take 1 capsule (20 mg total) by mouth daily. 90 capsule 1  . predniSONE (DELTASONE) 5 MG tablet Take 1 tablet (5 mg total) by mouth daily with breakfast. 90 tablet 1  . sucralfate (CARAFATE) 1 g tablet Take 1 tablet (1 g total) by mouth 4 (four) times daily - after meals and at bedtime. 120 tablet 3  . Fluticasone-Umeclidin-Vilant (TRELEGY ELLIPTA) 100-62.5-25 MCG/INH AEPB Inhale 1 puff into the lungs daily. 180 each 1   No current facility-administered medications for this visit.    Allergies: No Known Allergies I reviewed his past medical history, social history, family history, and environmental history and no significant changes have been reported from previous visit on 09/29/2018.  Review of Systems  Constitutional: Negative for chills and fever.  HENT: Negative for congestion, postnasal drip, rhinorrhea, sneezing and sore throat.   Eyes: Negative for pain, discharge and itching.  Respiratory: Positive for cough, chest tightness, shortness of breath and wheezing.   Cardiovascular: Negative.   Gastrointestinal: Negative.   Musculoskeletal: Negative.    Skin: Negative.   Neurological: Negative.    Objective: Physical Exam Not obtained as encounter was done via telephone.   Previous notes and tests were reviewed.  I discussed the assessment and treatment plan with the patient. The patient was provided an opportunity to ask questions and all were answered. The patient agreed with the plan and demonstrated an understanding of the instructions.   The patient was advised to call back or seek an in-person evaluation if the symptoms worsen or if the condition fails to improve as anticipated.  I provided 32 minutes of non-face-to-face time during this encounter.  It was my pleasure to participate in [place]'s care today. Please feel free to contact me with any questions or concerns.   Sincerely,  Shaylar Larose Hires, MD

## 2019-06-21 NOTE — Patient Instructions (Addendum)
Asthma, severe persistent   - Control remains poor  We will have him stop Breo and Spiriva use and replace with Trelegy 1 puff daily.  Trelegy is a triple therapy inhaler that has been recently indicated for asthma control.  This will help to simplify his routine.   - continue your prednisone 5mg  daily   - I have discussed on many visits about the use of Fasenra for additional asthma control however he would need to establish care with the [facility] in order to receive the injections through his insurance.  - have access to albuterol inhaler 2 puffs every 4-6 hours as needed for cough/wheeze/shortness of breath/chest tightness.  May use 15-20 minutes prior to activity.   Monitor frequency of use.    Asthma control goals:   Full participation in all desired activities (may need albuterol before activity)  Albuterol use two time or less a week on average (not counting use with activity)  Cough interfering with sleep two time or less a month  Oral steroids no more than once a year  No hospitalizations  Reflux/dysphagia     - continue Prilosec daily      - continue lifestyle modifications  Allergies, non-allergic     - continue flonase 1-2 sprays daily as needed for nasal congestion/drainage   Follow-up 4 months or sooner if needed

## 2019-06-22 ENCOUNTER — Other Ambulatory Visit: Payer: Self-pay | Admitting: *Deleted

## 2019-10-24 ENCOUNTER — Ambulatory Visit (INDEPENDENT_AMBULATORY_CARE_PROVIDER_SITE_OTHER): Admitting: Allergy

## 2019-10-24 ENCOUNTER — Encounter: Payer: Self-pay | Admitting: Allergy

## 2019-10-24 ENCOUNTER — Other Ambulatory Visit: Payer: Self-pay

## 2019-10-24 DIAGNOSIS — J4550 Severe persistent asthma, uncomplicated: Secondary | ICD-10-CM | POA: Diagnosis not present

## 2019-10-24 DIAGNOSIS — K2100 Gastro-esophageal reflux disease with esophagitis, without bleeding: Secondary | ICD-10-CM

## 2019-10-24 DIAGNOSIS — J310 Chronic rhinitis: Secondary | ICD-10-CM | POA: Diagnosis not present

## 2019-10-24 MED ORDER — ALBUTEROL SULFATE HFA 108 (90 BASE) MCG/ACT IN AERS: INHALATION_SPRAY | RESPIRATORY_TRACT | 1 refills | Status: DC

## 2019-10-24 MED ORDER — MONTELUKAST SODIUM 10 MG PO TABS: 10.0000 mg | ORAL_TABLET | Freq: Every day | ORAL | 1 refills | Status: DC

## 2019-10-24 MED ORDER — TRELEGY ELLIPTA 100-62.5-25 MCG/INH IN AEPB: 1.0000 | INHALATION_SPRAY | Freq: Every day | RESPIRATORY_TRACT | 1 refills | Status: AC

## 2019-10-24 MED ORDER — PREDNISONE 5 MG PO TABS: 5.0000 mg | ORAL_TABLET | Freq: Every day | ORAL | 1 refills | Status: DC

## 2019-10-24 MED ORDER — FLUTICASONE PROPIONATE 50 MCG/ACT NA SUSP: 1.0000 | Freq: Every day | NASAL | 1 refills | Status: DC

## 2019-10-24 MED ORDER — OMEPRAZOLE 20 MG PO CPDR: 20.0000 mg | DELAYED_RELEASE_CAPSULE | Freq: Every day | ORAL | 1 refills | Status: AC

## 2019-10-24 NOTE — Progress Notes (Signed)
RE: Dustin Macias MRN: [id number] DOB: [birth year] Date of Telemedicine Visit: 10/24/2019  Referring provider: Tomma Lightning, MD Primary care provider: Tomma Lightning, MD  Chief Complaint: Follow-up   Telemedicine Follow Up Visit via Telephone: I connected with Dustin Macias for a follow up on 10/24/19 by telephone and verified that I am speaking with the correct person using two identifiers.   I discussed the limitations, risks, security and privacy concerns of performing an evaluation and management service by telephone and the availability of in person appointments. I also discussed with the patient that there may be a patient responsible charge related to this service. The patient expressed understanding and agreed to proceed.  Patient is at work.  Provider is at the office.  Visit start time: 1047 Visit end time: 40 Insurance consent/check in by: Fae Pippin C Medical consent and medical assistant/nurse: Ashleigh V  History of Present Illness: He is a 49 y.o. male, who is being followed for severe asthma, non-allergic rhinitis and reflux. His previous allergy visit was on 06/21/2019 via televisit  with Dr. Delorse Lek.   He states he has been doing about the same as he was at his last visit.  He states he still uses his albuterol multiple times a day.  He is using Trelegy 1 puff a day at this time.  He feels like it is about comparable to the [place] and Spiriva but it is better from a once a day use standpoint.  He continues on his prednisone 5 mg daily use.  He states he did have to miss a day from work in the past month due to a flareup of his asthma.  He is about out of his prednisone.  He has not yet establish care at the [facility] which has been asked of him in order to be able to initiate [place] [facility] for improved asthma care. He states he does take omeprazole to help with reflux control.  He also will use Flonase about 1-2 times a week to help with any nasal congestion symptoms.  Assessment and  Plan: [name] is a 49 y.o. male with:   Asthma, severe persistent   - Control is somewhat better however is still not under good control   - Continue Trelegy 1 puff daily.   - continue your prednisone 5mg  daily   - I have discussed on many visits about the use of Fasenra for additional asthma control however he previously would need to establish care with the [facility] in order to receive the injections through his insurance however this was never done.  Now the a think we may be able to get the Fasenra pen approved for we can potentially bypass the need of using the [place] for administration.  We will look into this.  - have access to albuterol inhaler 2 puffs every 4-6 hours as needed for cough/wheeze/shortness of breath/chest tightness.  May use 15-20 minutes prior to activity.   Monitor frequency of use.    Asthma control goals:   Full participation in all desired activities (may need albuterol before activity)  Albuterol use two time or less a week on average (not counting use with activity)  Cough interfering with sleep two time or less a month  Oral steroids no more than once a year  No hospitalizations  Reflux/dysphagia     - continue Prilosec daily      - continue lifestyle modifications  Allergies, non-allergic     - continue flonase 1-2 sprays daily as needed for nasal  congestion/drainage   Follow-up 4 months or sooner if needed  Diagnostics: None.  Medication List:  Current Outpatient Medications  Medication Sig Dispense Refill  . acetaminophen (TYLENOL) 500 MG tablet Take 1,000-1,500 mg by mouth daily as needed for moderate pain or headache.    . albuterol (PROAIR HFA) 108 (90 Base) MCG/ACT inhaler Inhale two puffs every 4-6 hours if needed for cough or wheeze 54 g 1  . calcium carbonate (TUMS - DOSED IN MG ELEMENTAL CALCIUM) 500 MG chewable tablet Chew 2-3 tablets by mouth daily as needed for indigestion or heartburn.    . fluticasone (FLONASE) 50 MCG/ACT  nasal spray Place 1-2 sprays into both nostrils daily. 48 g 1  . Fluticasone-Umeclidin-Vilant (TRELEGY ELLIPTA) 100-62.5-25 MCG/INH AEPB Inhale 1 puff into the lungs daily. 84 each 1  . ibuprofen (ADVIL,MOTRIN) 200 MG tablet Take 600 mg by mouth every 6 (six) hours as needed for headache or moderate pain.    . montelukast (SINGULAIR) 10 MG tablet Take 1 tablet (10 mg total) by mouth at bedtime. 90 tablet 1  . omeprazole (PRILOSEC) 20 MG capsule Take 1 capsule (20 mg total) by mouth daily. 90 capsule 1  . predniSONE (DELTASONE) 5 MG tablet Take 1 tablet (5 mg total) by mouth daily with breakfast. 90 tablet 1   No current facility-administered medications for this visit.   Allergies: No Known Allergies I reviewed his past medical history, social history, family history, and environmental history and no significant changes have been reported from previous visit on 06/21/2019.  Review of Systems  Constitutional: Negative.   HENT: Positive for congestion.   Eyes: Negative.   Respiratory: Positive for cough, chest tightness, shortness of breath and wheezing.   Cardiovascular: Negative.   Gastrointestinal: Negative.   Musculoskeletal: Negative.   Skin: Negative.   Neurological: Negative.    Objective: Physical Exam Not obtained as encounter was done via telephone.   Previous notes and tests were reviewed.  I discussed the assessment and treatment plan with the patient. The patient was provided an opportunity to ask questions and all were answered. The patient agreed with the plan and demonstrated an understanding of the instructions.   The patient was advised to call back or seek an in-person evaluation if the symptoms worsen or if the condition fails to improve as anticipated.  I provided 11 minutes of non-face-to-face time during this encounter.  It was my pleasure to participate in [address] [place]'s care today. Please feel free to contact me with any questions or concerns.    Sincerely,  Kameko Hukill Charmian Muff, MD

## 2019-10-24 NOTE — Patient Instructions (Addendum)
Asthma, severe persistent   - Control is somewhat better however is still not under good control   - Continue Trelegy 1 puff daily.   - continue your prednisone 5mg  daily   - I have discussed on many visits about the use of Fasenra for additional asthma control however he previously would need to establish care with the [facility] in order to receive the injections through his insurance however this was never done.  Now the a think we may be able to get the Fasenra pen approved for we can potentially bypass the need of using the [place] for administration.  We will look into this.  - have access to albuterol inhaler 2 puffs every 4-6 hours as needed for cough/wheeze/shortness of breath/chest tightness.  May use 15-20 minutes prior to activity.   Monitor frequency of use.    Asthma control goals:   Full participation in all desired activities (may need albuterol before activity)  Albuterol use two time or less a week on average (not counting use with activity)  Cough interfering with sleep two time or less a month  Oral steroids no more than once a year  No hospitalizations  Reflux/dysphagia     - continue Prilosec daily      - continue lifestyle modifications  Allergies, non-allergic     - continue flonase 1-2 sprays daily as needed for nasal congestion/drainage   Follow-up 4 months or sooner if needed

## 2019-10-25 ENCOUNTER — Telehealth: Payer: Self-pay | Admitting: *Deleted

## 2019-10-25 NOTE — Telephone Encounter (Signed)
-----   Message from [facility] Larose Hires, MD sent at 10/24/2019  2:06 PM EST ----- Hi.  This is the pt I have been trying to convince to est care at [facility] so that we could do [place].  If the pen is available for him to home use would recommend that for me.

## 2019-10-25 NOTE — Telephone Encounter (Signed)
L/M for patient to contact me to advise approval and submit to [facility]

## 2019-11-14 NOTE — Telephone Encounter (Signed)
L/m for patient to contact me  

## 2019-11-29 NOTE — Telephone Encounter (Signed)
Ok this will be my last efforts to get better control of his asthma and get him on a biologic.  We have made multiple attempts now over the last several years to initiate care and he never gets back with [place] or follow-ups regarding this.

## 2019-11-29 NOTE — Telephone Encounter (Signed)
Patient never returned my calls

## 2020-01-08 ENCOUNTER — Other Ambulatory Visit: Payer: Self-pay | Admitting: Family Medicine

## 2020-04-14 ENCOUNTER — Other Ambulatory Visit: Payer: Self-pay | Admitting: Allergy

## 2020-04-21 ENCOUNTER — Other Ambulatory Visit: Payer: Self-pay | Admitting: Allergy

## 2020-06-18 ENCOUNTER — Other Ambulatory Visit: Payer: Self-pay | Admitting: Allergy

## 2020-07-01 ENCOUNTER — Other Ambulatory Visit: Payer: Self-pay | Admitting: Allergy

## 2020-09-13 ENCOUNTER — Other Ambulatory Visit: Payer: Self-pay | Admitting: Family Medicine

## 2021-08-18 ENCOUNTER — Other Ambulatory Visit: Payer: Self-pay | Admitting: Allergy

## 2023-08-10 ENCOUNTER — Encounter: Payer: Self-pay | Admitting: Allergy

## 2023-08-10 ENCOUNTER — Ambulatory Visit: Admitting: Allergy

## 2023-08-10 VITALS — BP 110/82 | HR 83 | Temp 97.7°F | Resp 16 | Ht 75.2 in | Wt 231.1 lb

## 2023-08-10 DIAGNOSIS — J310 Chronic rhinitis: Secondary | ICD-10-CM | POA: Diagnosis not present

## 2023-08-10 DIAGNOSIS — K219 Gastro-esophageal reflux disease without esophagitis: Secondary | ICD-10-CM | POA: Diagnosis not present

## 2023-08-10 DIAGNOSIS — J4550 Severe persistent asthma, uncomplicated: Secondary | ICD-10-CM | POA: Diagnosis not present

## 2023-08-10 MED ORDER — MONTELUKAST SODIUM 10 MG PO TABS: 10.0000 mg | ORAL_TABLET | Freq: Every day | ORAL | 5 refills | Status: AC

## 2023-08-10 MED ORDER — PREDNISONE 5 MG PO TABS: 5.0000 mg | ORAL_TABLET | Freq: Every day | ORAL | 5 refills | Status: AC

## 2023-08-10 MED ORDER — TRELEGY ELLIPTA 100-62.5-25 MCG/ACT IN AEPB: 1.0000 | INHALATION_SPRAY | Freq: Every day | RESPIRATORY_TRACT | 5 refills | Status: AC

## 2023-08-10 MED ORDER — FLUTICASONE PROPIONATE 50 MCG/ACT NA SUSP: NASAL | 5 refills | Status: AC

## 2023-08-10 MED ORDER — OMEPRAZOLE 20 MG PO CPDR: 20.0000 mg | DELAYED_RELEASE_CAPSULE | Freq: Every day | ORAL | 5 refills | Status: AC | PRN

## 2023-08-10 MED ORDER — ALBUTEROL SULFATE HFA 108 (90 BASE) MCG/ACT IN AERS: 2.0000 | INHALATION_SPRAY | Freq: Four times a day (QID) | RESPIRATORY_TRACT | 1 refills | Status: DC | PRN

## 2023-08-10 NOTE — Patient Instructions (Addendum)
Asthma, severe persistent   - under good control at this time   - continue Trelegy 1 puff daily.   - continue prednisone 5mg  daily   - continue Singulair 10mg  daily   - discussed updates in asthma care and discussed Tezspire as our latest asthma injectable medication to provide asthma control.  It is a medication for all types of asthma.  It is a steroid-sparing medication that allows for decrease in steroid needs (prednisone and steroid need by inhaler use).  It is a monthly injection that can be done at home if comfortable.  Will have Tammy, our Community education officer, reach regarding insurance coverage.   - have access to albuterol inhaler 2 puffs every 4-6 hours as needed for cough/wheeze/shortness of breath/chest tightness.  May use 15-20 minutes prior to activity.   Monitor frequency of use.    Asthma control goals:  Full participation in all desired activities (may need albuterol before activity) Albuterol use two time or less a week on average (not counting use with activity) Cough interfering with sleep two time or less a month Oral steroids no more than once a year No hospitalizations  Reflux/dysphagia     - continue Omeprazole daily as needed     - continue lifestyle modifications  Allergies, non-allergic     - can use flonase 1-2 sprays daily as needed for nasal congestion/drainage   Follow-up 6 months or sooner if needed

## 2023-08-10 NOTE — Progress Notes (Signed)
New Patient Note  RE: Dustin Macias MRN: [id number] DOB: 1970-12-11 Date of Office Visit: 08/10/2023  Primary care provider: none at this time  Chief Complaint: Asthma  History of present illness: Dustin Macias is a 52 y.o. male presenting today for evaluation of asthma. He is a former patient of my practice with last visit on 10/24/2019 for asthma, nonallergic rhinitis and reflux.  Discussed the use of AI scribe software for clinical note transcription with the patient, who gave verbal consent to proceed.  The patient, with a history of asthma, presents for prescription refills and management of his asthma he reports that his asthma control has improved significantly over the past three years, with no severe episodes requiring ambulance transport, a marked improvement from his previous state. He attributes this improvement to his current regimen of Trelegy 1 puff daily and 5mg  prednisone, which he has been on since his last visit three years ago. He also takes Singulair and uses an albuterol inhaler as needed. Despite the overall improvement, he still experiences occasional wheezing, which he believes is triggered by drastic temperature changes.  The patient also reports having contracted COVID-19 twice and having had pneumonia and bronchitis since his last visit. Despite these illnesses, he did not experience significant deterioration in his breathing control. He also uses a nebulizer for breathing treatments when he has these respiratory infections.  In addition to his asthma, the patient has been managing reflux with over-the-counter Prilosec, but he expresses interest in getting a prescription for omeprazole through [facility]. He denies any current issues with nasal congestion or drainage.     Review of systems: 10pt ROS negative unless noted above in HPI  All other systems negative unless noted above in HPI  Past medical history: Past Medical History:  Diagnosis Date    Asthma    Difficulty swallowing    GERD (gastroesophageal reflux disease)     Past surgical history: Past Surgical History:  Procedure Laterality Date   ADENOIDECTOMY     FACIAL FRACTURE SURGERY     HERNIA REPAIR     TONSILLECTOMY     WRIST SURGERY      Family history:  Family History  Problem Relation Age of Onset   Allergic rhinitis Neg Hx    Angioedema Neg Hx    Asthma Neg Hx    Eczema Neg Hx    Immunodeficiency Neg Hx    Urticaria Neg Hx    Atopy Neg Hx     Social history: Lives in a home without carpeting with electric and heat pump heating with central cooling.  No pets in the home.  There is no concern for water damage, mildew or roaches in the home.  He is retired from [employer].  He does not report current smoking history.   Medication List: Current Outpatient Medications  Medication Sig Dispense Refill   acetaminophen (TYLENOL) 500 MG tablet Take 1,000-1,500 mg by mouth daily as needed for moderate pain or headache.     albuterol (PROAIR HFA) 108 (90 Base) MCG/ACT inhaler USE 2 INHALATIONS EVERY 4 TO 6 HOURS IF NEEDED FOR COUGH OR WHEEZE 8.5 g 0   albuterol (VENTOLIN HFA) 108 (90 Base) MCG/ACT inhaler Inhale 2 puffs into the lungs every 6 (six) hours as needed for wheezing or shortness of breath. 18 g 1   calcium carbonate (TUMS - DOSED IN MG ELEMENTAL CALCIUM) 500 MG chewable tablet Chew 2-3 tablets by mouth daily as needed for indigestion or heartburn.  fluticasone (FLONASE) 50 MCG/ACT nasal spray 1-2 sprays daily as needed for congestion/drainage 16 g 5   Fluticasone-Umeclidin-Vilant (TRELEGY ELLIPTA) 100-62.5-25 MCG/INH AEPB Inhale 1 puff into the lungs daily. 84 each 1   ibuprofen (ADVIL,MOTRIN) 200 MG tablet Take 600 mg by mouth every 6 (six) hours as needed for headache or moderate pain.     montelukast (SINGULAIR) 10 MG tablet TAKE 1 TABLET AT BEDTIME 30 tablet 0   montelukast (SINGULAIR) 10 MG tablet Take 1 tablet (10 mg total) by mouth at bedtime. 30  tablet 5   omeprazole (PRILOSEC) 20 MG capsule Take 1 capsule (20 mg total) by mouth daily. 90 capsule 1   omeprazole (PRILOSEC) 20 MG capsule Take 1 capsule (20 mg total) by mouth daily as needed. 30 capsule 5   predniSONE (DELTASONE) 5 MG tablet TAKE 1 TABLET DAILY WITH BREAKFAST 90 tablet 1   predniSONE (DELTASONE) 5 MG tablet Take 1 tablet (5 mg total) by mouth daily with breakfast. 30 tablet 5   TRELEGY ELLIPTA 100-62.5-25 MCG/ACT AEPB Inhale 1 puff into the lungs daily. 28 each 5   No current facility-administered medications for this visit.    Known medication allergies: No Known Allergies   Physical examination: Blood pressure 110/82, pulse 83, temperature 97.7 F (36.5 C), temperature source Temporal, resp. rate 16, height 6' 3.2" (1.91 m), weight 231 lb 1.6 oz (104.8 kg), SpO2 96%.  General: Alert, interactive, in no acute distress. HEENT: PERRLA, TMs pearly gray, turbinates non-edematous without discharge, post-pharynx non erythematous. Neck: Supple without lymphadenopathy. Lungs: Clear to auscultation without wheezing, rhonchi or rales. {no increased work of breathing. CV: Normal S1, S2 without murmurs. Abdomen: Nondistended, nontender. Skin: Warm and dry, without lesions or rashes. Extremities:  No clubbing, cyanosis or edema. Neuro:   Grossly intact.  Diagnositics/Labs:  Spirometry: FEV1: 3.74 L 88%, FVC: 5.41 L or 99%, ratio consistent with nonobstructive pattern  Assessment and plan:   Asthma, severe persistent   - under good control at this time   - continue Trelegy 1 puff daily.   - continue prednisone 5mg  daily   - continue Singulair 10mg  daily   - discussed updates in asthma care and discussed Tezspire as our latest asthma injectable medication to provide asthma control.  It is a medication for all types of asthma.  It is a steroid-sparing medication that allows for decrease in steroid needs (prednisone and steroid need by inhaler use).  It is a monthly  injection that can be done at home if comfortable.  Will have Tammy, our Community education officer, reach regarding insurance coverage.   - have access to albuterol inhaler 2 puffs every 4-6 hours as needed for cough/wheeze/shortness of breath/chest tightness.  May use 15-20 minutes prior to activity.   Monitor frequency of use.    Asthma control goals:  Full participation in all desired activities (may need albuterol before activity) Albuterol use two time or less a week on average (not counting use with activity) Cough interfering with sleep two time or less a month Oral steroids no more than once a year No hospitalizations  Reflux/dysphagia     - continue Omeprazole daily as needed     - continue lifestyle modifications  Allergies, non-allergic     - can use flonase 1-2 sprays daily as needed for nasal congestion/drainage   Follow-up 6 months or sooner if needed  I appreciate the opportunity to take part in Koji's care. Please do not hesitate to contact me with questions.  Sincerely,  Margo Aye, MD Allergy/Immunology [facility]

## 2023-08-15 ENCOUNTER — Telehealth: Payer: Self-pay | Admitting: *Deleted

## 2023-08-15 NOTE — Telephone Encounter (Signed)
L/m for patient to contact me to advise approval and submit for Tezspire 210mg  pen to [facility]

## 2023-08-15 NOTE — Telephone Encounter (Signed)
-----   Message from [facility] [place] sent at 08/10/2023  5:54 PM EST ----- Does insurance cover tezspire?  If so patient is interested for asthma control so that he can get off the prednisone

## 2023-08-30 NOTE — Telephone Encounter (Signed)
L/m for patient again to reach out to me °

## 2023-09-05 NOTE — Telephone Encounter (Signed)
Patient never returned calls 

## 2024-04-04 ENCOUNTER — Other Ambulatory Visit: Payer: Self-pay | Admitting: Allergy

## 2024-07-19 ENCOUNTER — Other Ambulatory Visit: Payer: Self-pay | Admitting: Allergy
